# Patient Record
Sex: Female | Born: 1980 | Race: White | Hispanic: No | Marital: Married | Smoking: Never smoker
Health system: Southern US, Community
[De-identification: ages and names within clinical notes are randomized; demographics above are authoritative.]

## PROBLEM LIST (undated history)

## (undated) DIAGNOSIS — R5383 Other fatigue: Secondary | ICD-10-CM

## (undated) DIAGNOSIS — R002 Palpitations: Secondary | ICD-10-CM

## (undated) DIAGNOSIS — D509 Iron deficiency anemia, unspecified: Secondary | ICD-10-CM

## (undated) DIAGNOSIS — R202 Paresthesia of skin: Secondary | ICD-10-CM

## (undated) DIAGNOSIS — G43109 Migraine with aura, not intractable, without status migrainosus: Secondary | ICD-10-CM

## (undated) HISTORY — DX: Migraine with aura, not intractable, without status migrainosus: G43.109

## (undated) HISTORY — DX: Iron deficiency anemia, unspecified: D50.9

## (undated) HISTORY — DX: Other fatigue: R53.83

## (undated) HISTORY — DX: Palpitations: R00.2

## (undated) HISTORY — DX: Paresthesia of skin: R20.2

---

## 2004-10-01 ENCOUNTER — Encounter (INDEPENDENT_AMBULATORY_CARE_PROVIDER_SITE_OTHER): Payer: Self-pay | Admitting: *Deleted

## 2004-10-01 ENCOUNTER — Ambulatory Visit (HOSPITAL_COMMUNITY): Admission: RE | Admit: 2004-10-01 | Discharge: 2004-10-01 | Payer: Self-pay | Admitting: Obstetrics and Gynecology

## 2005-04-28 ENCOUNTER — Inpatient Hospital Stay (HOSPITAL_COMMUNITY): Admission: AD | Admit: 2005-04-28 | Discharge: 2005-04-28 | Payer: Self-pay | Admitting: Obstetrics & Gynecology

## 2005-05-16 ENCOUNTER — Other Ambulatory Visit: Admission: RE | Admit: 2005-05-16 | Discharge: 2005-05-16 | Payer: Self-pay | Admitting: Obstetrics and Gynecology

## 2005-08-21 ENCOUNTER — Inpatient Hospital Stay (HOSPITAL_COMMUNITY): Admission: AD | Admit: 2005-08-21 | Discharge: 2005-08-21 | Payer: Self-pay | Admitting: Obstetrics & Gynecology

## 2005-09-15 ENCOUNTER — Ambulatory Visit (HOSPITAL_COMMUNITY): Admission: AD | Admit: 2005-09-15 | Discharge: 2005-09-15 | Payer: Self-pay | Admitting: Obstetrics and Gynecology

## 2005-10-12 ENCOUNTER — Inpatient Hospital Stay (HOSPITAL_COMMUNITY): Admission: AD | Admit: 2005-10-12 | Discharge: 2005-10-12 | Payer: Self-pay | Admitting: Obstetrics and Gynecology

## 2005-10-18 ENCOUNTER — Ambulatory Visit: Payer: Self-pay | Admitting: *Deleted

## 2005-10-25 ENCOUNTER — Ambulatory Visit: Payer: Self-pay | Admitting: *Deleted

## 2005-11-01 ENCOUNTER — Observation Stay (HOSPITAL_COMMUNITY): Admission: AD | Admit: 2005-11-01 | Discharge: 2005-11-03 | Payer: Self-pay | Admitting: Obstetrics and Gynecology

## 2005-11-01 ENCOUNTER — Ambulatory Visit: Payer: Self-pay | Admitting: *Deleted

## 2005-11-08 ENCOUNTER — Ambulatory Visit: Payer: Self-pay | Admitting: *Deleted

## 2005-11-11 ENCOUNTER — Ambulatory Visit: Payer: Self-pay | Admitting: *Deleted

## 2005-11-13 ENCOUNTER — Inpatient Hospital Stay (HOSPITAL_COMMUNITY): Admission: AD | Admit: 2005-11-13 | Discharge: 2005-11-13 | Payer: Self-pay | Admitting: Obstetrics and Gynecology

## 2005-11-15 ENCOUNTER — Inpatient Hospital Stay (HOSPITAL_COMMUNITY): Admission: AD | Admit: 2005-11-15 | Discharge: 2005-11-15 | Payer: Self-pay | Admitting: Obstetrics and Gynecology

## 2005-11-18 ENCOUNTER — Inpatient Hospital Stay (HOSPITAL_COMMUNITY): Admission: RE | Admit: 2005-11-18 | Discharge: 2005-11-21 | Payer: Self-pay | Admitting: Obstetrics and Gynecology

## 2005-11-18 ENCOUNTER — Encounter (INDEPENDENT_AMBULATORY_CARE_PROVIDER_SITE_OTHER): Payer: Self-pay | Admitting: *Deleted

## 2005-12-26 ENCOUNTER — Other Ambulatory Visit: Admission: RE | Admit: 2005-12-26 | Discharge: 2005-12-26 | Payer: Self-pay | Admitting: Obstetrics and Gynecology

## 2006-08-02 ENCOUNTER — Encounter: Admission: RE | Admit: 2006-08-02 | Discharge: 2006-10-31 | Payer: Self-pay | Admitting: Emergency Medicine

## 2007-02-07 ENCOUNTER — Ambulatory Visit (HOSPITAL_COMMUNITY): Admission: RE | Admit: 2007-02-07 | Discharge: 2007-02-07 | Payer: Self-pay | Admitting: Obstetrics and Gynecology

## 2007-04-02 IMAGING — US US FETAL BPP W/O NONSTRESS
2 series · 13 of 28 positions shown · non-contrast
Comparison: none

CLINICAL DATA: TWIN BIOPHYSICAL PROFILE:

[Series 1: us fetal bpp w/o nonstress · 0.37mm/px · 2 of 4 slices shown (1 of 2)]
[im 2/4]
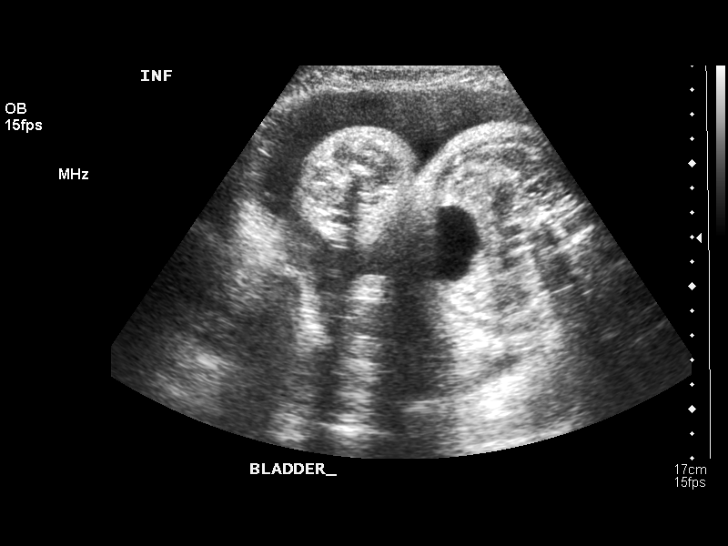
[im 4/4]
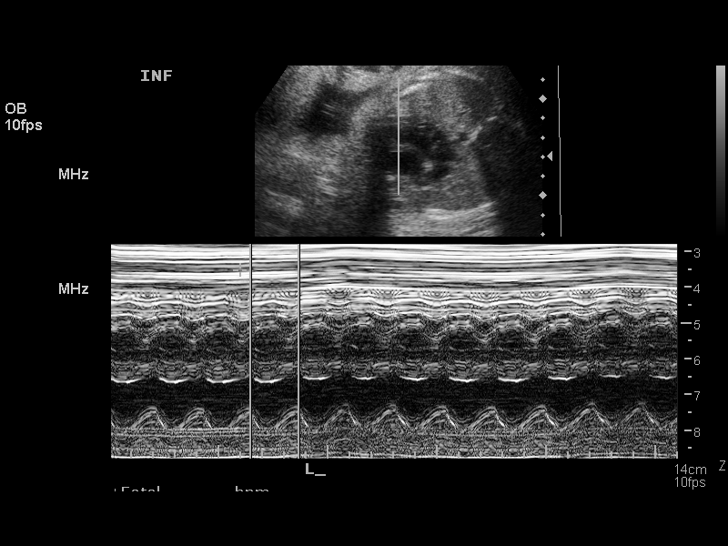

[Series 1: us fetal bpp w/o nonstress · 0.37mm/px · 11 of 24 slices shown (2 of 2)]
[im 2/24]
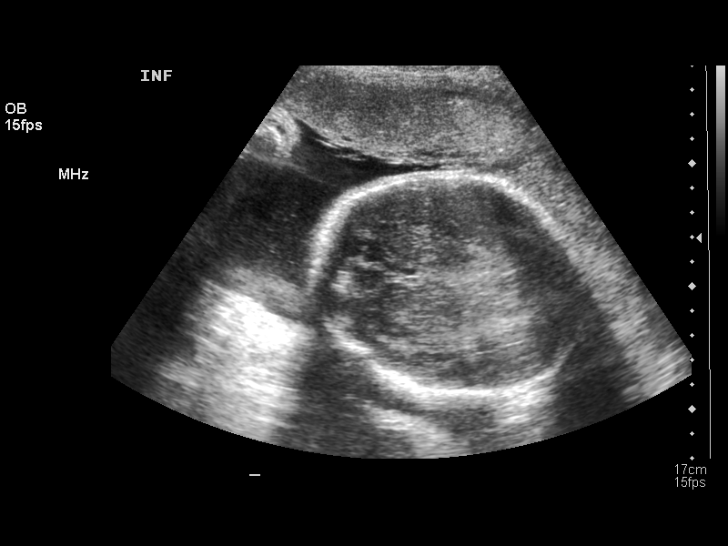
[im 4/24]
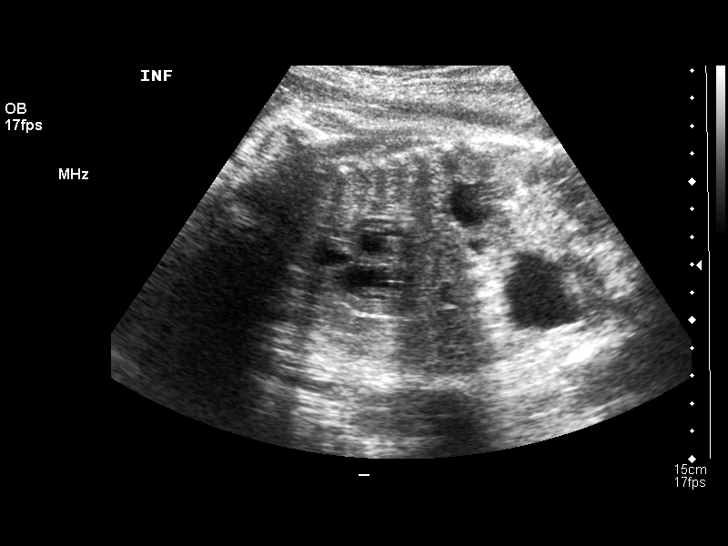
[im 6/24]
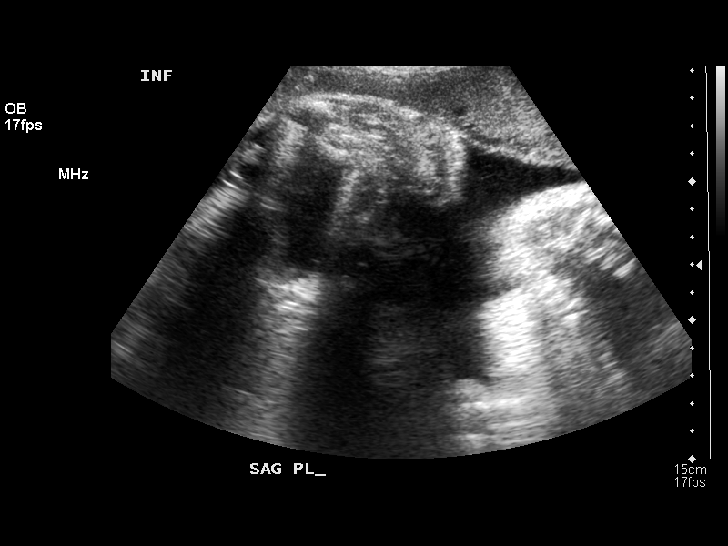
[im 8/24]
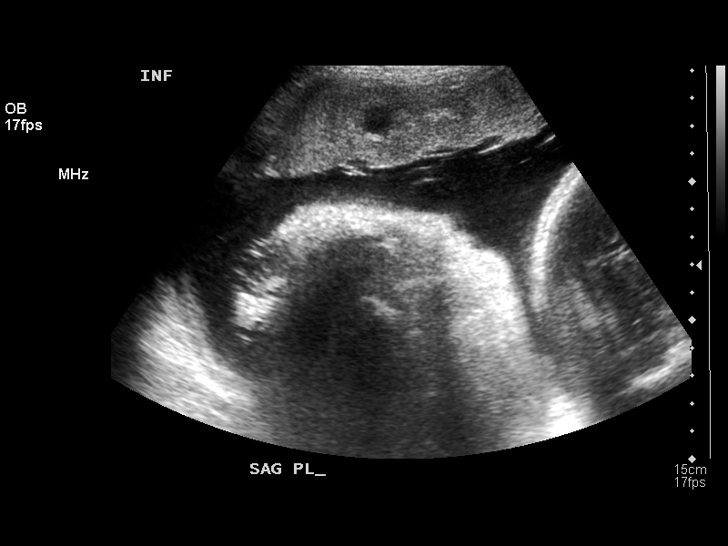
[im 11/24]
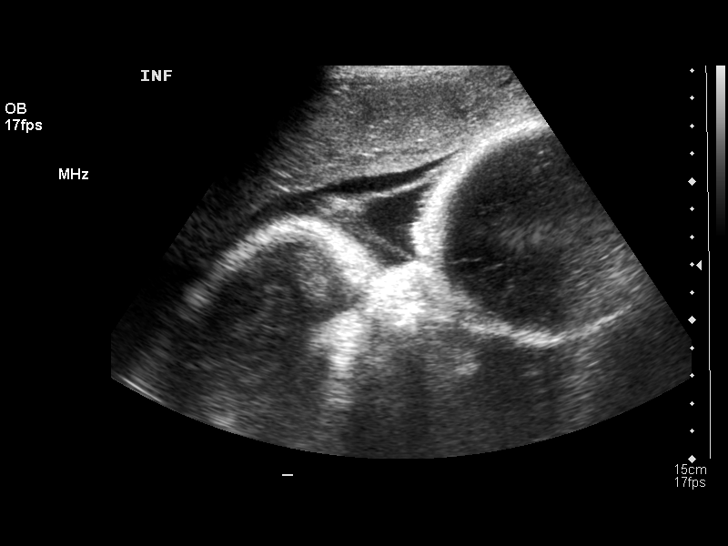
[im 13/24]
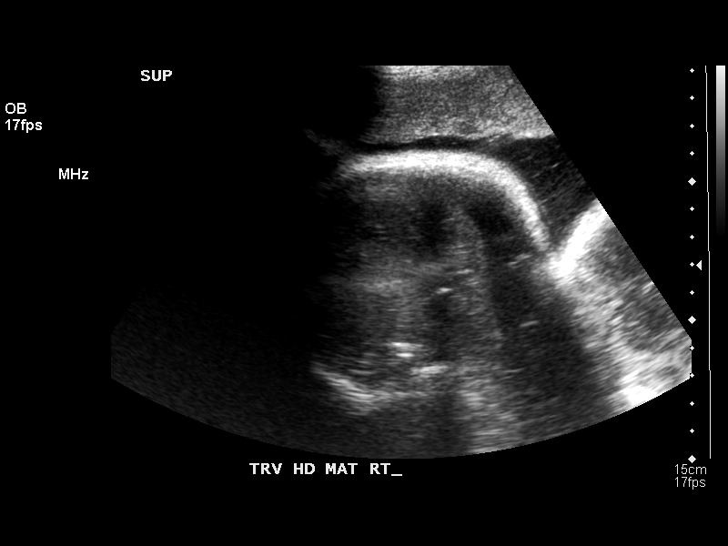
[im 15/24]
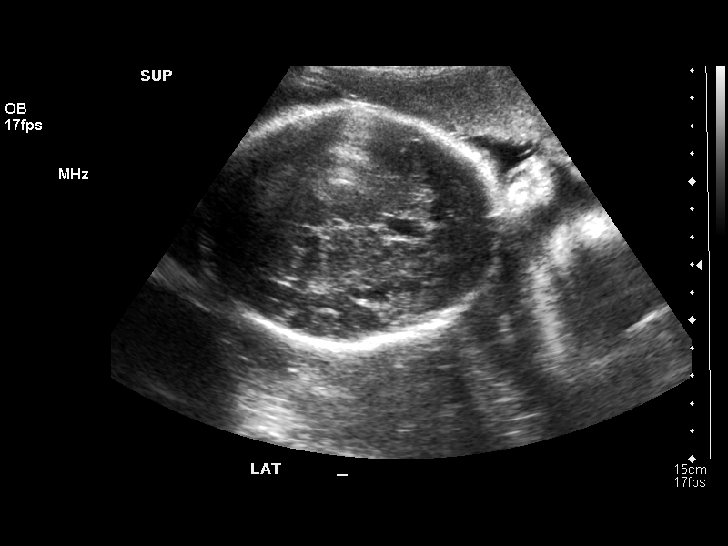
[im 17/24]
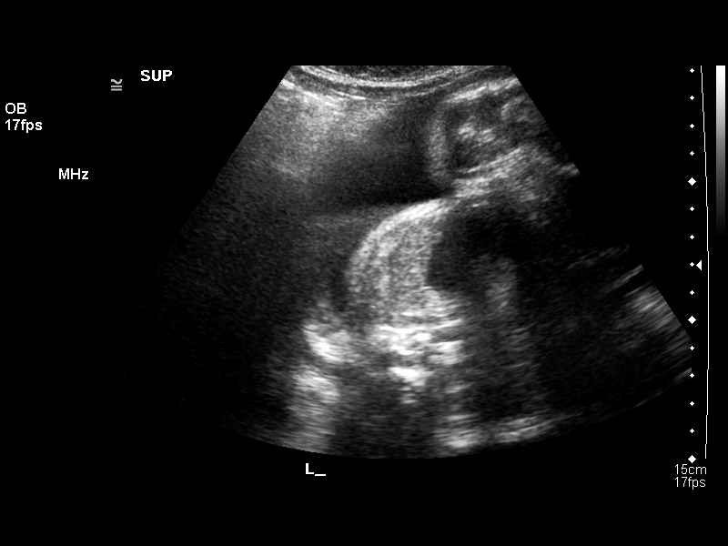
[im 19/24]
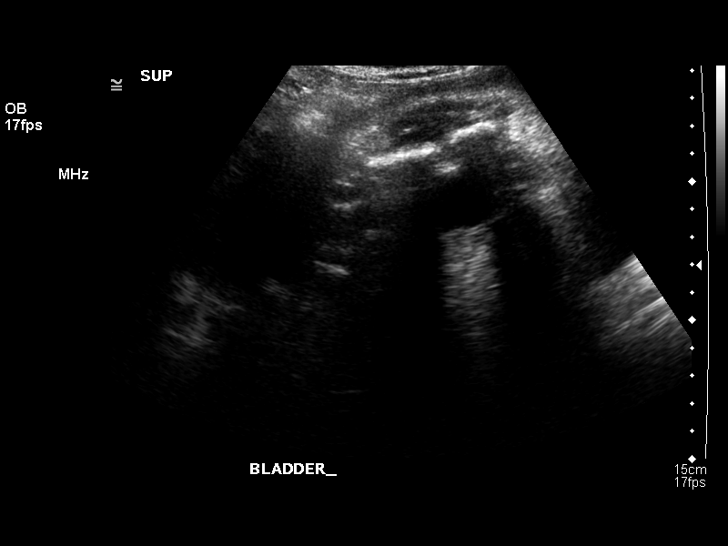
[im 21/24]
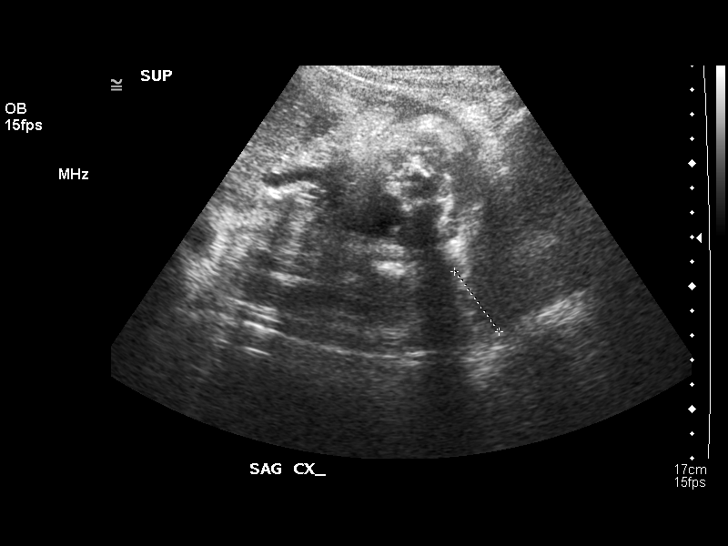
[im 23/24]
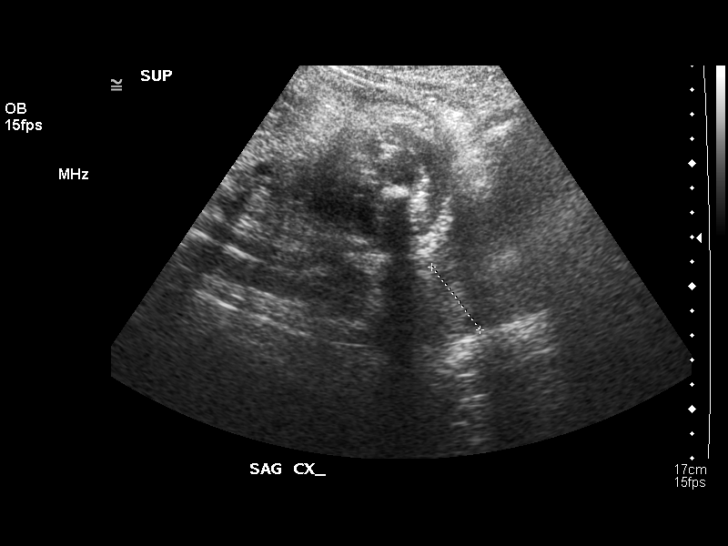

[13 of 28 positions shown; findings below may reference images not displayed]

FETUS A:
 Heart rate:      136 bpm
 Movement:  yes
 Breathing:  yes
 Presentation:  inferiorly in breech position
 Placental Location:  anterior
 Grade:  I
 Previa:  no
 Amniotic Fluid (Subjective):  normal
 Amniotic Fluid (Objective):  6.6 cm vertical pocket, which is within normal limits   

 Fetal measurements and complete anatomic evaluation were not requested.  The following fetal anatomy was visualized on this exam:  Stomach and bladder.

 BPP SCORING (TWIN A)
 Movement:  2  Time:  10 minutes
 Breathing:  2
 Tone:  2
 Amniotic Fluid:  2
 Total Score:  8

 FETUS B:
 Heart rate:      144 bpm
 Movement:  yes
 Breathing:  yes
 Presentation:  transverse with head on maternal right
 Placental Location:  anterior
 Grade:  I
 Previa:  no
 Amniotic Fluid (Subjective):  normal
 Amniotic Fluid (Objective):  5.2 cm vertical pocket, which is within normal limits   

 Fetal measurements and complete anatomic evaluation were not requested.  The following fetal anatomy was visualized on this exam:  Lateral ventricles, stomach, kidneys, and bladder.

 BPP SCORING (TWIN B)
 Movement:  2  Time:  10 minutes
 Breathing:  2
 Tone:  2
 Amniotic Fluid:  2
 Total Score:  8

 MATERNAL UTERINE AND ADNEXAL FINDINGS
 Cervix:  3.0 transabdominally.
IMPRESSION: Biophysical profile score for fetus A and fetus B is [DATE] over 10 minutes.

## 2007-04-11 ENCOUNTER — Inpatient Hospital Stay (HOSPITAL_COMMUNITY): Admission: AD | Admit: 2007-04-11 | Discharge: 2007-04-11 | Payer: Self-pay | Admitting: Obstetrics and Gynecology

## 2007-05-03 ENCOUNTER — Inpatient Hospital Stay (HOSPITAL_COMMUNITY): Admission: AD | Admit: 2007-05-03 | Discharge: 2007-05-05 | Payer: Self-pay | Admitting: Obstetrics and Gynecology

## 2007-05-03 ENCOUNTER — Encounter (INDEPENDENT_AMBULATORY_CARE_PROVIDER_SITE_OTHER): Payer: Self-pay | Admitting: Obstetrics and Gynecology

## 2008-10-29 ENCOUNTER — Encounter: Admission: RE | Admit: 2008-10-29 | Discharge: 2008-10-29 | Payer: Self-pay | Admitting: Obstetrics and Gynecology

## 2010-12-11 ENCOUNTER — Encounter: Payer: Self-pay | Admitting: Obstetrics and Gynecology

## 2011-04-08 NOTE — Op Note (Signed)
[name], [name]                  ACCOUNT NO.:  [id number]   MEDICAL RECORD NO.:  [id number]          PATIENT TYPE:  INP   LOCATION:  9199                          FACILITY:  [facility]   PHYSICIAN:  Juluis Mire, M.D.   DATE OF BIRTH:  1981/04/19   DATE OF PROCEDURE:  11/18/2005  DATE OF DISCHARGE:                                 OPERATIVE REPORT   PREOPERATIVE DIAGNOSES:  1.  Twin pregnancies at 37 weeks with pregnancy-induced hypertension.  2.  Infants in the breech and transverse lie.   POSTOPERATIVE DIAGNOSES:  1.  Twin pregnancies at 37 weeks with pregnancy-induced hypertension.  2.  Infants in the breech and transverse lie.   OPERATIVE PROCEDURE:  Low transverse cesarean section.   SURGEON:  Juluis Mire, M.D.   ASSISTANT:  Stann Mainland. Vincente Poli, M.D.   ANESTHESIA:  Spinal.   ESTIMATED BLOOD LOSS:  500-600 mL.   PACKS AND DRAINS:  None.   INTRAOPERATIVE BLOOD REPLACED:  None.   COMPLICATIONS:  None.   INDICATIONS:  Noted in the history and physical.   PROCEDURE:  The patient was taken to the OR and placed in the supine  position with a left lateral tilt.  After a satisfactory level of spinal  anesthesia obtained, the abdomen was prepped out with Betadine and draped in  a sterile field.  A low transverse skin incision made with a knife, carried  through subcutaneous tissue.  The fascia was entered sharply and the  incision in the fascia extended laterally.  The fascia taken off the muscle  superiorly and inferiorly.  The rectus muscles were separated in the  midline.  The peritoneum was entered sharply, the incision in the peritoneum  extended both superiorly and inferiorly.  A low transverse bladder flap was  developed.  A low transverse uterine incision was begun with the knife and  extended laterally using manual traction.  Twin A presented in the breech  presentation and was delivered in the usual manner.  The infant was a viable  female.  Apgars were 8/9.  Twin  B was converted from transverse to vertex  presentation, membranes were ruptured, and the infant was delivered with  elevation of the head and fundal pressure, and was a viable female whose  Apgars were 8/9.  The placenta was then delivered manually and sent to  pathological review.  The uterus was closed in a running locking suture of 0  chromic in a two-layered closure technique.  Hemostasis was excellent.  Tubes and ovaries were unremarkable.  Muscles reapproximated with a running  suture of 3-0 Vicryl, fascia with a running suture of 0 PDS.  The skin was closed with staples and Steri-Strips.  Sponge, instrument and  needle count reported as correct by circulating nurse x2, Foley catheter  remained clear at time of closure.  The patient tolerated the procedure  well, was returned to the recovery room in good condition.      Juluis Mire, M.D.  Electronically Signed     JSM/MEDQ  D:  11/18/2005  T:  11/18/2005  Job:  [id number]

## 2011-04-08 NOTE — H&P (Signed)
NAME:  [name], [name] NO.:  [id number]   MEDICAL RECORD NO.:  [id number]           PATIENT TYPE:   LOCATION:                                 FACILITY:   PHYSICIAN:  Juluis Mire, M.D.        DATE OF BIRTH:   DATE OF ADMISSION:  10/01/2004  DATE OF DISCHARGE:                                HISTORY & PHYSICAL   HISTORY OF PRESENT ILLNESS:  The patient is a 30 year old gravida 1, para 0,  married white female, last menstrual period of July 22, 2004.  Recently  has moved to this area from [place].  Has been having trouble with first  trimester spotting.  Serial ultrasounds have revealed a gestational sac  consistent with seven weeks.  There was no evidence of fetal pole.  There  was an enlarging yoke sac. This was consistent with a nonviable trimester  pregnancy and the patient now presents for dilatation and evacuation.  Blood  type is A-.   ALLERGIES:  No known drug allergies.   CURRENT MEDICATIONS:  Prenatal vitamins.   PAST MEDICAL HISTORY:  Usual childhood diseases.  Does have a history of  migraine headaches.  The only surgical history is wisdom teeth extraction.   FAMILY HISTORY:  Noncontributory.   SOCIAL HISTORY:  No tobacco or alcohol use.   REVIEW OF SYSTEMS:  Noncontributory.   PHYSICAL EXAMINATION:  VITAL SIGNS:  The patient is afebrile with stable  vital signs.  HEENT:  The patient is normocephalic.  Pupils equal, round, reactive to  light and accommodation.  Extraocular movements were intact.  Sclerae and  conjunctivae and oropharynx are clear.  NECK:  Without thyromegaly.  BREASTS:  Not examined.  LUNGS:  Clear.  CARDIAC:  A regular rhythm and rate without murmurs or gallops.  ABDOMEN:  Exam is benign.  No masses or organomegaly or tenderness.  PELVIC:  Normal external genitalia.  Vagina is closed and clear.  Cervix is  unremarkable.  Uterus is 8 weeks in size.  Adnexa unremarkable.  EXTREMITIES:  Trace edema.  NEUROLOGICAL:  Exam  is grossly within normal limits.   IMPRESSION:  Nonviable first trimester pregnancy.   PLAN:  The patient will undergo dilatation and evacuation.  The risks of  surgery have been discussed including the risk of infection.  The risk of  hemorrhage that would require transfusion and possible repeat dilatation and  curettage.  The risk of perforation that could lead to injury to adjacent organs  requiring further exploratory surgery.  The risk of deep venous thrombosis  and pulmonary embolus.  The patient verbalizes an understanding of the  indications and risks.      JSM/MEDQ  D:  10/01/2004  T:  10/01/2004  Job:  [id number]

## 2011-04-08 NOTE — H&P (Signed)
[name], [name]                  ACCOUNT NO.:  [id number]   MEDICAL RECORD NO.:  [id number]          PATIENT TYPE:  INP   LOCATION:                                [facility]   PHYSICIAN:  Zelphia Cairo, MD    DATE OF BIRTH:  [birth year]   DATE OF ADMISSION:  11/01/2005  DATE OF DISCHARGE:                                HISTORY & PHYSICAL   ADMISSION DIAGNOSES:  1.  Intrauterine pregnancy at 34 and 3 weeks.  2.  Twins.  3.  Elevated blood pressure.   HISTORY OF PRESENT ILLNESS:  A 30 year old G2, P0-0-1-0, at 69 and 2/7ths  weeks gestation, who presented to the office today with elevated blood  pressure of 138/84, and increasing edema in her hands and face.  She denies  any complaints of contractions, cramping or vaginal bleeding.  She does  complain of some blurred vision, headache and epigastric pain.   PAST MEDICAL HISTORY:  Negative.   PAST SURGICAL HISTORY:  D&E in 2005.   SOCIAL HISTORY:  Negative for tobacco, alcohol, or drug use.   OBSTETRIC HISTORY:  In 2005, she had a missed AB.  D&E with no complications  at 9-[redacted] weeks gestation.   GYNECOLOGIC HISTORY:  Negative for abnormal Pap smears or sexually  transmitted infections.  Pap smear this pregnancy was normal.   PRENATAL CARE:  Blood type is A negative.  She is status post RhoGAM on  September 15, 2005.   PHYSICAL EXAMINATION:  VITAL SIGNS:  Weight 205 pounds.  Blood pressure 123-  150/75-89.  ABDOMEN:  Gravid and nontender.  EXTREMITIES:  There was 2+ edema.  Symmetrical.   LABORATORY VALUES:  Preeclampsia labs are normal.  Potassium is 2.7.   ASSESSMENT AND PLAN:  A 30 year old G2, P0 at 34 weeks with twin gestation  and elevated blood pressures.  1.  Preeclampsia labs were normal; however, we will admit her for a 24-hour      urine collection and repeat labs in      the morning, given her symptoms of preeclampsia.  2.  We will repeat her NST and labs in the morning.  3.  Potassium replacement  p.o.      Zelphia Cairo, MD  Electronically Signed     GA/MEDQ  D:  11/01/2005  T:  11/01/2005  Job:  [id number]

## 2011-04-08 NOTE — H&P (Signed)
NAMEMirka, Barbone Jasira                  ACCOUNT NO.:  [id number]   MEDICAL RECORD NO.:  [id number]          PATIENT TYPE:  INP   LOCATION:  9126                          [facility]ILITY[facility]   PHYSICIAN:  Juluis Mire, M.D.   DATE OF BIRTH:  [birth year]   DATE OF ADMISSION:  05/03/2007  DATE OF DISCHARGE:                              HISTORY & PHYSICAL   The patient is a 25-year gravida 3, para 2 female who presents to [facility]  for evaluation of decreased amniotic fluid.   The patient has a history of prior cesarean section, had a scheduled  cesarean section next week.  We saw her today, amniotic fluid index was  6.8 cm with the 4th percentile.  Cervix was extremely unfavorable being  long and closed. After discussion of options we decided to proceed with  repeat cesarean section for which she is admitted at the present time.  The option of trial of labor have been discussed but in view of the  unfavorable cervix and decreased fluid it was decided against.   ALLERGIES:  Sensitivity to many NARCOTICS.   MEDICATIONS:  Include prenatal vitamins.   PAST MEDICAL HISTORY.:   FAMILY HISTORY AND SOCIAL HISTORY:  Please see prenatal records.   REVIEW OF SYSTEMS:  Noncontributory.   PHYSICAL EXAMINATION:  VITAL SIGNS:  The patient afebrile, stable vital  signs.  HEENT: The patient is normocephalic.  Pupils equal and react to light  accommodation.  Extraocular movements were intact.  Sclerae and  conjunctivae are clear.  Oropharynx clear.  NECK:  Without thyromegaly.  BREASTS:  Not examined.  LUNGS:  Clear.  HEART:  Regular rhythm rate, grade 2/6 systolic ejection murmur.  No  clicks or gallops.  ABDOMEN: Gravid uterus consistent with dates.  PELVIC: The cervix is long and closed.  EXTREMITIES:  Trace edema.  NEUROLOGY:  Grossly within normal limits.   IMPRESSION:  1. Intrauterine pregnancy at 39+ weeks.  2. Prior cesarean section.  3. Oligohydramnios.   PLAN:  The patient undergo primary  cesarean section.  The risks have  been discussed including the risk of infection.  The risk of hemorrhage  could require transfusion with the risk of AIDS or hepatitis.  The risk  of injury to adjacent organs including bladder, bowel, ureters that  could require further exploratory surgery.  Risk of deep venous  thrombosis, pulmonary embolus.  The patient does understand indications  and risks.      Juluis Mire, M.D.  Electronically Signed     JSM/MEDQ  D:  05/03/2007  T:  05/03/2007  Job:  [id number]

## 2011-04-08 NOTE — Discharge Summary (Signed)
NAMEDayra, [name]                  ACCOUNT NO.:  [id number]   MEDICAL RECORD NO.:  [id number]          PATIENT TYPE:  INP   LOCATION:  9126                          [facility]ACILITY[facility]   PHYSICIAN:  Juluis Mire, M.D.   DATE OF BIRTH:  1981/11/01   DATE OF ADMISSION:  05/03/2007  DATE OF DISCHARGE:  05/05/2007                               DISCHARGE SUMMARY   ADMISSION DIAGNOSES:  1. Intrauterine pregnancy at 17 weeks' estimated gestational age.  2. Previous cesarean section.  3. Oligohydramnios.   DISCHARGE DIAGNOSIS:  Status post low transverse cesarean section with a  viable female infant.   PROCEDURE:  Repeat low transverse cesarean section.   REASON FOR ADMISSION:  Please see dictated H&P.   HOSPITAL COURSE:  The patient is 30 year old gravida 3, para 2 that  presented to [facility] for evaluation due to decreased  amniotic fluid.  The patient had had a history of a previous cesarean  section and was scheduled for cesarean in approximately 1 week.  The  patient was seen on the day of admission and was noted amniotic fluid  index of 6.8 which was in the 4th percentile.  Cervix was extremely  unfavorable long and closed.  After discussion decision was made to  proceed with a repeat cesarean section.  The patient was admitted now  for delivery.  The patient was then transferred to the operating room  where spinal anesthesia was administered without difficulty.  Low  transverse incision was made with delivery of a viable female infant  weighing 8 pounds 10 ounces Apgars 9 at 1 and 9 at 5 minutes.  Arterial  cord pH of 7.30.  The patient tolerated procedure well and taken to the  recovery room in stable condition.  On postoperative day #1 the patient  was without complaint.  Vital signs were stable.  Abdomen soft.  Fundus  firm and nontender.  Abdominal dressing was noted to be clean, dry and  intact.  Urine output was noted to be good and laboratory findings  revealed  hemoglobin of 11.3.  Postoperative day #2 the patient did  desire early discharge.  Vital signs were stable.  She is afebrile.  Fundus firm and nontender.  Abdominal dressing had been removed  revealing an incision was that was clean, dry and intact.  Staples were  intact.  Discharge instructions were reviewed and the patient was later  discharged home.   CONDITION ON DISCHARGE:  Good, diet regular as tolerated.   ACTIVITY:  No heavy lifting, no driving x2 weeks, no vaginal entry.   FOLLOW UP:  Patient to follow up in the office in 2-3 days for staple  removal.  She is to call for temperature greater than 100 degrees,  persistent nausea, vomiting, heavy vaginal bleeding and/or redness or  drainage from incisional site.   DISCHARGE MEDICATIONS:  1. Tylox #30 one p.o. q.4-6 hours.  2. Motrin 600 mg every 6 hours.  3. Prenatal vitamins one p.o. daily.  4. Colace one p.o. daily p.r.n.      Julio Sicks, N.P.  Juluis Mire, M.D.  Electronically Signed    CC/MEDQ  D:  06/04/2007  T:  06/04/2007  Job:  [id number]

## 2011-04-08 NOTE — H&P (Signed)
NAMERomelia, [name]                  ACCOUNT NO.:  [id number]   MEDICAL RECORD NO.:  [id number]          PATIENT TYPE:   LOCATION:                                 FACILITY:   PHYSICIAN:  Juluis Mire, M.D.        DATE OF BIRTH:   DATE OF ADMISSION:  11/18/2005  DATE OF DISCHARGE:                                HISTORY & PHYSICAL   Patient is a 30 year old gravida 2, para 0, abortus 1 female.  Last  menstrual period April 14 giving her an estimated date of confinement of  January 20.  This gives her an estimated gestational age of [redacted] weeks.  This  is consistent with initial evaluation and early ultrasound.   Her prenatal course has been complicated by twin pregnancies.  The twins  have been in the breech transverse lie.  It has also been complicated by  pregnancy induced hypertension.  She has developed elevated blood pressures  with systolics of 140-150 over diastolics between 80 and 90.  We have had  her on bed rest and close monitoring.  Because she is 37 weeks with twins  and development of pregnancy induced hypertension she now presents for  primary cesarean section.   ALLERGIES:  No known drug allergies.   MEDICATIONS:  Prenatal vitamins.   For past medical history, family history, and social history please see  prenatal records.   REVIEW OF SYSTEMS:  Noncontributory.   PHYSICAL EXAMINATION:  VITAL SIGNS:  Patient is afebrile.  Stable vital  signs.  HEENT:  Patient normocephalic.  Pupils are equal, round, and reactive to  light and accommodation.  Extraocular movements are intact.  Sclerae and  conjunctivae clear.  Oropharynx clear.  NECK:  Without thyromegaly.  BREASTS:  Glandular, but no discrete masses.  LUNGS:  Clear.  CARDIOVASCULAR:  Regular rate and rhythm.  Grade 2/6 systolic ejection  murmur.  No clicks or gallops.  ABDOMEN:  Gravid uterus.  PELVIC:  Deferred.  EXTREMITIES:  2-3+ edema.  Deep tendon reflexes 2+.  No clonus.   IMPRESSION:  1.  Twin  pregnancies at 37 weeks.  2.  Pregnancy induced hypertension.  3.  Breech transverse presentation.   PLAN:  The patient will undergo primary cesarean section.  The risks were  discussed including the risks of infection, risk of hemorrhage that could  require transfusion, risk of AIDS or hepatitis, risk of injury to adjacent  organs including bladder, bowel, ureters that could require further  exploratory surgery, risk of deep venous thrombosis and pulmonary embolus.  Patient expressed understanding of indications and risks.      Juluis Mire, M.D.  Electronically Signed     JSM/MEDQ  D:  11/18/2005  T:  11/18/2005  Job:  [id number]

## 2011-04-08 NOTE — Op Note (Signed)
NAMEErnestine, [name]                  ACCOUNT NO.:  [id number]   MEDICAL RECORD NO.:  [id number]          PATIENT TYPE:  AMB   LOCATION:  SDC                           [facility]   PHYSICIAN:  Juluis Mire, M.D.   DATE OF BIRTH:  [birth year]   DATE OF PROCEDURE:  10/01/2004  DATE OF DISCHARGE:                                 OPERATIVE REPORT   PREOPERATIVE DIAGNOSES:  Nonviable first trimester pregnancy.   POSTOPERATIVE DIAGNOSES:  Nonviable first trimester pregnancy.   OPERATION:  1.  Paracervical block.  2.  Cervical dilatation.  3.  Intrauterine evacuation.   SURGEON:  Juluis Mire, M.D.   ANESTHESIA:  Sedation with paracervical block.   ESTIMATED BLOOD LOSS:  Minimal.   PACKS/DRAINS:  None.   INTRAOPERATIVE BLOOD REPLACED:  None.   COMPLICATIONS:  None.   INDICATIONS FOR PROCEDURE:  Dictated in history and physical.   DESCRIPTION OF PROCEDURE:  The patient was taken to the OR, placed in the  supine position.  After sedation was placed in the dorsal lithotomy position  using the Allen stirrups. The patient was draped in a sterile field, a  speculum was placed in the vaginal vault. The cervix and vagina were  cleansed with Betadine. The paracervical block was instituted using 1%  Nesacaine. The cervix was grasped with a single tooth tenaculum, uterus was  posterior in orientation. It sounded to approximately 10 cm. The cervix was  serially dilated to a size 26 Pratt dilator.  Intrauterine contents were  emptied using suction curetting. This continued until no additional tissue  was obtained. At this point in time, sharp and repeat suction curetting  revealed all quadrants to be clear. The uterus was contracting down well  with minimal bleeding, there were no signs of perforation. A single tooth  tenaculum and speculum were then removed, patient taken out of the dorsal  lithotomy position once alert and transferred to the recovery room in  good condition. Sponge,  needle and instrument counts were reported as  correct by the circulating nurse. __________ will be given.  `      JSM/MEDQ  D:  10/01/2004  T:  10/01/2004  Job:  [id number]

## 2011-04-08 NOTE — Op Note (Signed)
NAMESindy, [name]                  ACCOUNT NO.:  [id number]   MEDICAL RECORD NO.:  [id number]          PATIENT TYPE:  INP   LOCATION:  9126                          [facility]   PHYSICIAN:  Juluis Mire, M.D.   DATE OF BIRTH:  March 06, 1981   DATE OF PROCEDURE:  05/03/2007  DATE OF DISCHARGE:                               OPERATIVE REPORT   PREOPERATIVE DIAGNOSIS:  Intrauterine pregnancy at 40+ weeks with prior  cesarean section.  Oligohydramnios.   POSTOPERATIVE DIAGNOSIS:  Intrauterine pregnancy at 40+ weeks with prior  cesarean section.  Oligohydramnios.   PROCEDURES:  Repeat low transverse cesarean section.   SURGEON:  Juluis Mire, M.D.   ANESTHESIA:  Spinal.   ESTIMATED BLOOD LOSS:  400-500 mL.   PACKS AND DRAINS:  None.   BLOOD REPLACED:  None.   COMPLICATIONS:  None.   INDICATIONS:  Dictated history and physical.   DESCRIPTION OF PROCEDURE:  The patient was taken to OR, placed supine  position with left lateral tilt.  After satisfactory level spinal  anesthesia obtained, abdomen prepped out Betadine and draped sterile  field.  Low transverse skin incision made knife carried through  subcutaneous tissue.  Fascia was entered sharply, incision to fascia  extended laterally.  Fascia taken off the muscle superiorly inferiorly.  Rectus muscles were separated midline. Anterior perineum was entered  sharply, incision of perineum extended both superiorly inferiorly.  A  low transverse bladder flap developed.  A low transverse uterine  incision begun with knife extended laterally using manual traction.  The  infant presented vertex presentation, delivered with elevation head and  fundal pressure. The infant was a viable female weighing 8 pounds 10  ounces Apgars were 9/9.  Umbilical artery pH was 7.30.  Amniotic fluid  was clear.  The placenta was delivered manually and sent to pathology.  Uterus then closed with locking suture of 0 chromic using two-layer  closure  technique.  We had good hemostasis and clear urine output.  Tubes and ovaries unremarkable.  We irrigated the pelvis and had good  hemostasis.  At this point time muscles and peritoneum closed with a  suture of 3-0  Vicryl.  Fascia closed with a suture of 0 PDS.  Skin was closed staples  and Steri-Strips.  Sponge, instrument and needle count was correct by  circulating nurse x2.  Foley catheter remained clear at time of closure.  The patient tolerated the procedure well and returned to the recovery  room in good condition.      Juluis Mire, M.D.  Electronically Signed     JSM/MEDQ  D:  05/03/2007  T:  05/03/2007  Job:  [id number]

## 2011-04-08 NOTE — Discharge Summary (Signed)
NAMEKathyleen, Radice Huber                  ACCOUNT NO.:  [id number]   MEDICAL RECORD NO.:  [id number]          PATIENT TYPE:  INP   LOCATION:  9130                          FACILITY:  [facility]   PHYSICIAN:  Zelphia Cairo, MD    DATE OF BIRTH:  August 12, 1981   DATE OF ADMISSION:  11/18/2005  DATE OF DISCHARGE:  11/21/2005                                 DISCHARGE SUMMARY   ADMITTING DIAGNOSES:  1.  Intrauterine pregnancy at 13 weeks' estimated gestational age.  2.  Twin gestational.  3.  Pregnancy-induced hypertension.   DISCHARGE DIAGNOSES:  1.  Status post low transverse cesarean section.  2.  Viable female infant.   PROCEDURE:  Primary low transverse cesarean section.   REASON FOR ADMISSION:  Please see dictated H&P.   HOSPITAL COURSE:  The patient is a 30 year old gravida 2, para 0 who was  admitted to [facility] at 37 weeks' estimated gestational  age for a cesarean delivery.  The patient was known to have a twin gestation  which twins had been in the breech transverse presentation.  Pregnancy had  also been complicated by pregnancy-induced hypertension. The patient had  developed some elevated blood pressure with systolics of 140-150 over  diastolics between 80-90.  The patient had been on bed rest with close  monitoring. Because the patient was 3 weeks' with a twin gestation with  development of pregnancy induced hypertension, the patient was now admitted  for a scheduled cesarean section.  On the morning of admission the patient  was taken to the operating room where spinal anesthesia was administered  without difficulty.  A low transverse incision was made with delivery of  viable twin A female, weighing 5 pounds 13 ounces with Apgars of 8 at one  minute and 9 at five minutes.  Arterial cord pH was 7.30.  Twin B,  also a  female weighing 5 pounds 5 ounces, Apgars of 8 at one minute and 9 at five  minutes.  Arterial cord pH was also noted to be 7.30.  The patient  tolerated  procedure well and taken to the recovery room in stable condition.  On  postoperative day #1, the patient was without complaint.  Vital signs were  stable.  She was afebrile.  Urine output was within normal limits.  Abdomen  soft.  Fundus firm and nontender.  Abdominal dressing was noted to be clean,  dry and intact.  Laboratory findings revealed hemoglobin of 8.7, platelet  count of 207,000, WBC count of 15.6.  On postoperative day #2,  the patient  was doing well, tolerating a regular diet without complaints of nausea and  vomiting.  She was ambulating well.  Vital signs remained stable.  She was  afebrile.  Abdomen soft.  Fundus firm and nontender.  Incision was clean,  dry and intact.  On postoperative day #3, the patient was without complaint.  Pain was well controlled on oral medication.  Vital signs stable. Blood  pressure 124-130/80s.  Abdomen soft.  Incision was clean, dry and intact.  Staples were removed.  The patient  was later discharged home.   CONDITION ON DISCHARGE:  Good.   DIET:  Regular as tolerated.   ACTIVITY:  No heavy lifting, no driving x2 weeks, no vaginal entry.   FOLLOWUP:  Patient is to follow up in the office in one week for an incision  check.  She is to call for temperature greater than 100 degrees, persistent  nausea and vomiting, heavy vaginal bleeding and/or redness or drainage from  incisional site.   DISCHARGE MEDICATIONS:  1.  Tylox #30 one p.o. q.4-6h. p.r.n.  2.  Motrin 600 mg every 6 hours.  3.  Prenatal vitamins one p.o. daily.  4.  Colace one p.o. daily p.r.n.      Julio Sicks, N.P.      Zelphia Cairo, MD  Electronically Signed    CC/MEDQ  D:  12/12/2005  T:  12/13/2005  Job:  [id number]

## 2011-09-08 LAB — CBC
HCT: 33.6 — ABNORMAL LOW
Hemoglobin: 11.3 — ABNORMAL LOW
Hemoglobin: 12.2
MCHC: 34.4
MCV: 88.8
RBC: 3.74 — ABNORMAL LOW
RBC: 3.98
RDW: 14.5 — ABNORMAL HIGH
WBC: 9.5

## 2011-09-08 LAB — RH IMMUNE GLOB WKUP(>/=20WKS)(NOT WOMEN'S HOSP)

## 2011-09-08 LAB — RPR: RPR Ser Ql: NONREACTIVE

## 2021-11-09 ENCOUNTER — Encounter: Payer: Self-pay | Admitting: *Deleted

## 2021-11-09 ENCOUNTER — Other Ambulatory Visit: Payer: Self-pay | Admitting: *Deleted

## 2021-11-11 ENCOUNTER — Telehealth: Payer: Self-pay | Admitting: *Deleted

## 2021-11-11 NOTE — Telephone Encounter (Signed)
Contacted NP prior to her apt on 12/27 with Dr. Cathie Hoops. Directions provided to the cancer center as patient lives in [place]. She stated that her pcp wanted her to have iron infusion set up. I explained to her that Dr. Cathie Hoops will meet with her first next Tuesday and determine if iron infusions will be needed at that time. I explained to her that she would not be receiving the iron infusion on Tuesday. She gave verbal understanding of this. She stated that she has not tolerated her oral iron tablets due to GI distress. She has been taking these supplements as well as other other herbal supplements consistently. Her iron sats have not improved. I did ask her to bring a list of her herbal OTC supplements that she is currently taking with her next apt.  My chart link sent to her phone and patient will attempt to activate her mychart. She thanked me for calling her prior to the NP apt.

## 2021-11-16 ENCOUNTER — Other Ambulatory Visit: Payer: Self-pay

## 2021-11-16 ENCOUNTER — Inpatient Hospital Stay: Payer: 59

## 2021-11-16 ENCOUNTER — Encounter: Payer: Self-pay | Admitting: Oncology

## 2021-11-16 ENCOUNTER — Inpatient Hospital Stay: Payer: 59 | Attending: Oncology | Admitting: Oncology

## 2021-11-16 VITALS — BP 113/77 | HR 80 | Temp 97.2°F | Wt 236.0 lb

## 2021-11-16 DIAGNOSIS — E611 Iron deficiency: Secondary | ICD-10-CM | POA: Diagnosis present

## 2021-11-16 DIAGNOSIS — Z823 Family history of stroke: Secondary | ICD-10-CM

## 2021-11-16 DIAGNOSIS — Z8261 Family history of arthritis: Secondary | ICD-10-CM | POA: Diagnosis not present

## 2021-11-16 DIAGNOSIS — Z803 Family history of malignant neoplasm of breast: Secondary | ICD-10-CM | POA: Diagnosis not present

## 2021-11-16 DIAGNOSIS — Z801 Family history of malignant neoplasm of trachea, bronchus and lung: Secondary | ICD-10-CM | POA: Insufficient documentation

## 2021-11-16 DIAGNOSIS — Z833 Family history of diabetes mellitus: Secondary | ICD-10-CM

## 2021-11-16 DIAGNOSIS — Z8249 Family history of ischemic heart disease and other diseases of the circulatory system: Secondary | ICD-10-CM

## 2021-11-16 DIAGNOSIS — Z808 Family history of malignant neoplasm of other organs or systems: Secondary | ICD-10-CM | POA: Diagnosis not present

## 2021-11-16 NOTE — Progress Notes (Signed)
Hematology/Oncology Consult note [phone] [phone]      Patient Care Team: Orpha Bur, MD as PCP - General (Family Medicine) Rickard Patience, MD as Consulting Physician (Oncology)  REFERRING PROVIDER: Orpha Bur, MD  CHIEF COMPLAINTS/REASON FOR VISIT:  Evaluation of iron deficiency  HISTORY OF PRESENTING ILLNESS:   [name] is a  40 y.o.  female with PMH listed below was seen in consultation at the request of  Orpha Bur, MD  for evaluation of Iron deficiency  Patient has had blood work done at primary care provider's office and the results were scanned into media.  05/21/2021, iron panel showed saturation of 27, TIBC 442, ferritin 12. 06/21/2021, CBC showed hemoglobin 13.5, hematocrit 41.3, WBC 5.1, platelet count 245,000. 07/08/2021, iron panel showed a TIBC of 425, ferritin 14, iron saturation 21. 10/22/2021, patient had a brain MRI without contrast done for work-up of paresthesia.  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  10/28/2021, iron panel showed TIBC 368, iron saturation 21, ferritin 10. CBC showed a hemoglobin of 13.2, hematocrit 41.2, MCV 88.4.  Normal white count and platelet count.  Patient reports that she has been taking oral iron supplementation since she was informed that she has low iron level Initially she were taking Vitron C 2 tablets daily and she reports having upset stomach and constipation as side effects. Currently she takes 1 tablet of Vitron-C and another tablet of blood builder and feels the combination works better for her.  She reports a history of heavy menstrual bleeding.  Menstrual cycle  Is regular. Denies any black stool or bright red blood in the stool. Patient is in the process of having sleep apnea worked up,  planned sleep study 12/31/2021.Marland Kitchen  Patient reports feeling tired.  She has chronic forearm and hand numbness and tingling.  Review of Systems  Constitutional:  Negative for appetite change, chills, fatigue and fever.  HENT:   Negative for hearing loss and voice change.   Eyes:  Negative for eye problems.  Respiratory:  Negative for chest tightness and cough.   Cardiovascular:  Negative for chest pain.  Gastrointestinal:  Negative for abdominal distention, abdominal pain and blood in stool.  Endocrine: Negative for hot flashes.  Genitourinary:  Negative for difficulty urinating and frequency.   Musculoskeletal:  Negative for arthralgias.  Skin:  Negative for itching and rash.  Neurological:  Positive for numbness. Negative for extremity weakness.  Hematological:  Negative for adenopathy.  Psychiatric/Behavioral:  Negative for confusion.    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    Paresthesia of upper limb     SURGICAL HISTORY: Past Surgical History:  Procedure Laterality Date   CESAREAN SECTION      SOCIAL HISTORY: Social History   Socioeconomic History   Marital status: Married    Spouse name: Not on file   Number of children: Not on file   Years of education: Not on file   Highest education level: Not on file  Occupational History   Not on file  Tobacco Use   Smoking status: Never   Smokeless tobacco: Never  Substance and Sexual Activity   Alcohol use: Yes   Drug  use: Never   Sexual activity: Yes  Other Topics Concern   Not on file  Social History Narrative   Not on file   Social Determinants of Health   Financial Resource Strain: Not on file  Food Insecurity: Not on file  Transportation Needs: Not on file  Physical Activity: Not on file  Stress: Not on file  Social Connections: Not on file  Intimate Partner Violence: Not  on file    FAMILY HISTORY: Family History  Problem Relation Age of Onset   Arthritis/Rheumatoid Mother    Diabetes Mellitus I Father    Breast cancer Sister    Stroke Maternal Grandmother    Throat cancer Maternal Grandfather    Lung cancer Paternal Grandmother    Heart attack Paternal Grandfather     ALLERGIES:  has no allergies on file.  MEDICATIONS:  Current Outpatient Medications  Medication Sig Dispense Refill   ergocalciferol (VITAMIN D2) 1.25 MG (50000 UT) capsule Take 1 capsule by mouth once a week.     Ferrous Sulfate (IRON SUPPLEMENT PO) Take by mouth once.     Iron-Vitamin C (VITRON-C) 65-125 MG TABS Take by mouth once.     No current facility-administered medications for this visit.     PHYSICAL EXAMINATION: ECOG PERFORMANCE STATUS: 0 - Asymptomatic Vitals:   11/16/21 1151  BP: 113/77  Pulse: 80  Temp: (!) 97.2 F (36.2 C)   Filed Weights   11/16/21 1151  Weight: 236 lb (107 kg)    Physical Exam Constitutional:      General: She is not in acute distress. HENT:     Head: Normocephalic and atraumatic.  Eyes:     General: No scleral icterus. Cardiovascular:     Rate and Rhythm: Normal rate and regular rhythm.     Heart sounds: Normal heart sounds.  Pulmonary:     Effort: Pulmonary effort is normal. No respiratory distress.     Breath sounds: No wheezing.  Abdominal:     General: Bowel sounds are normal. There is no distension.     Palpations: Abdomen is soft.  Musculoskeletal:        General: No deformity. Normal range of motion.     Cervical back: Normal range of motion and neck supple.  Skin:    General: Skin is warm and dry.     Findings: No erythema or rash.  Neurological:     Mental Status: She is alert and oriented to person, place, and time. Mental status is at baseline.     Cranial Nerves: No cranial nerve deficit.     Coordination: Coordination normal.  Psychiatric:        Mood and Affect: Mood normal.    LABORATORY DATA:  I  have reviewed the data as listed Lab Results  Component Value Date   WBC 12.6 (H) 05/04/2007   HGB 11.3 (L) 05/04/2007   HCT 33.6 (L) 05/04/2007   MCV 89.8 05/04/2007   PLT 205 05/04/2007   No results for input(s): NA, K, CL, CO2, GLUCOSE, BUN, CREATININE, CALCIUM, GFRNONAA, GFRAA, PROT, ALBUMIN, AST, ALT, ALKPHOS, BILITOT, BILIDIR, IBILI in the last 8760 hours. Iron/TIBC/Ferritin/ %Sat No results found for: IRON, TIBC, FERRITIN, IRONPCTSAT    RADIOGRAPHIC STUDIES: I have personally reviewed the radiological images as listed and agreed with the findings in the report. No results found.    ASSESSMENT & PLAN:  1. Iron deficiency    #I reviewed her previous labs. CBC showed chronically stable and normal level of  hemoglobin.  She has had low level of ferritin, normal iron saturation. Discussed with patient that iron deficiency could be secondary to chronic blood loss, i.e. heavy menstrual bleeding.  Her hemoglobin has been stable and normal.  No urgency to start IV Venofer treatments at this point. In addition, sometimes we do see paradoxical normal or high level of hemoglobin with low ferritin level which could be a sign of secondary erythrocytosis due to medical conditions, ie sleep apnea.  Repeating iron may cause further increase of hemoglobin.  At this point, I recommend patient to continue what she has been doing with oral iron supplementation.  I will hold off IV iron treatments.  We will wait for her to finish her sleep study and rule in or rule out of a component of secondary erythrocytosis.  Should she experience any decreased hemoglobin and further decrease of ferritin, we will offer her IV Venofer treatments.  She agrees with the plan.  Orders Placed This Encounter  Procedures   Ferritin    Standing Status:   Future    Standing Expiration Date:   05/17/2022   CBC with Differential/Platelet    Standing Status:   Future    Standing Expiration Date:   11/16/2022   Retic Panel     Standing Status:   Future    Standing Expiration Date:   11/16/2022   Iron and TIBC    Standing Status:   Future    Standing Expiration Date:   11/16/2022    All questions were answered. The patient knows to call the clinic with any problems questions or concerns.   Natividad Brood, MD    Return of visit: 3 months Thank you for this kind referral and the opportunity to participate in the care of this patient. A copy of today's note is routed to referring provider   Earlie Server, MD, PhD [facility] Hematology Oncology 11/16/2021

## 2022-02-02 ENCOUNTER — Other Ambulatory Visit: Payer: Self-pay | Admitting: *Deleted

## 2022-02-02 DIAGNOSIS — E611 Iron deficiency: Secondary | ICD-10-CM

## 2022-02-11 ENCOUNTER — Inpatient Hospital Stay: Payer: 59 | Attending: Oncology

## 2022-02-11 ENCOUNTER — Other Ambulatory Visit: Payer: Self-pay

## 2022-02-11 DIAGNOSIS — N920 Excessive and frequent menstruation with regular cycle: Secondary | ICD-10-CM | POA: Diagnosis not present

## 2022-02-11 DIAGNOSIS — E611 Iron deficiency: Secondary | ICD-10-CM | POA: Insufficient documentation

## 2022-02-11 DIAGNOSIS — G4730 Sleep apnea, unspecified: Secondary | ICD-10-CM | POA: Diagnosis not present

## 2022-02-11 LAB — CBC WITH DIFFERENTIAL/PLATELET
Abs Immature Granulocytes: 0.02 10*3/uL (ref 0.00–0.07)
Basophils Absolute: 0 10*3/uL (ref 0.0–0.1)
Basophils Relative: 1 %
Eosinophils Absolute: 0.1 10*3/uL (ref 0.0–0.5)
Eosinophils Relative: 1 %
HCT: 39.6 % (ref 36.0–46.0)
Hemoglobin: 12.9 g/dL (ref 12.0–15.0)
Immature Granulocytes: 0 %
Lymphocytes Relative: 34 %
Lymphs Abs: 1.9 10*3/uL (ref 0.7–4.0)
MCH: 29 pg (ref 26.0–34.0)
MCHC: 32.6 g/dL (ref 30.0–36.0)
MCV: 89 fL (ref 80.0–100.0)
Monocytes Absolute: 0.4 10*3/uL (ref 0.1–1.0)
Monocytes Relative: 7 %
Neutro Abs: 3.2 10*3/uL (ref 1.7–7.7)
Neutrophils Relative %: 57 %
Platelets: 245 10*3/uL (ref 150–400)
RBC: 4.45 MIL/uL (ref 3.87–5.11)
RDW: 14 % (ref 11.5–15.5)
WBC: 5.7 10*3/uL (ref 4.0–10.5)
nRBC: 0 % (ref 0.0–0.2)

## 2022-02-11 LAB — RETIC PANEL
Immature Retic Fract: 15.2 % (ref 2.3–15.9)
RBC.: 4.43 MIL/uL (ref 3.87–5.11)
Retic Count, Absolute: 89 10*3/uL (ref 19.0–186.0)
Retic Ct Pct: 2 % (ref 0.4–3.1)
Reticulocyte Hemoglobin: 34.4 pg (ref >27.9)

## 2022-02-11 LAB — IRON AND TIBC
Iron: 53 ug/dL (ref 28–170)
Saturation Ratios: 14 % (ref 10.4–31.8)
TIBC: 392 ug/dL (ref 250–450)
UIBC: 339 ug/dL

## 2022-02-11 LAB — FERRITIN: Ferritin: 17 ng/mL (ref 11–307)

## 2022-02-14 ENCOUNTER — Inpatient Hospital Stay: Payer: 59

## 2022-02-14 ENCOUNTER — Inpatient Hospital Stay: Payer: 59 | Admitting: Oncology

## 2022-02-14 ENCOUNTER — Other Ambulatory Visit: Payer: Self-pay

## 2022-02-14 ENCOUNTER — Other Ambulatory Visit: Payer: 59

## 2022-02-14 ENCOUNTER — Encounter: Payer: Self-pay | Admitting: Oncology

## 2022-02-14 VITALS — BP 129/76 | HR 67 | Temp 96.9°F | Resp 18 | Wt 233.0 lb

## 2022-02-14 DIAGNOSIS — G4730 Sleep apnea, unspecified: Secondary | ICD-10-CM

## 2022-02-14 DIAGNOSIS — E611 Iron deficiency: Secondary | ICD-10-CM | POA: Diagnosis not present

## 2022-02-14 DIAGNOSIS — N920 Excessive and frequent menstruation with regular cycle: Secondary | ICD-10-CM | POA: Diagnosis not present

## 2022-02-14 NOTE — Progress Notes (Signed)
?Hematology/Oncology Progress note ?[phone] [phone] ?  ? ?   ? ? ?Patient Care Team: ?Natividad Brood, MD as PCP - General (Family Medicine) ?Earlie Server, MD as Consulting Physician (Oncology) ? ?REFERRING PROVIDER: ?Natividad Brood, MD  ?CHIEF COMPLAINTS/REASON FOR VISIT:  ?Follow-up for iron deficiency  ? ?HISTORY OF PRESENTING ILLNESS:  ? ?[name] is a  41 y.o.  female with PMH listed below was seen in consultation at the request of  Natividad Brood, MD  for evaluation of Iron deficiency ? ?Patient has had blood work done at primary care provider's office and the results were scanned into media. ? ?05/21/2021, iron panel showed saturation of 27, TIBC 442, ferritin 12. ?06/21/2021, CBC showed hemoglobin 13.5, hematocrit 41.3, WBC 5.1, platelet count 245,000. ?07/08/2021, iron panel showed a TIBC of 425, ferritin 14, iron saturation 21. ?10/22/2021, patient had a brain MRI without contrast done for work-up of paresthesia.  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 ? ?10/28/2021, iron panel showed TIBC 368, iron saturation 21, ferritin 10. ?CBC showed a hemoglobin of 13.2, hematocrit 41.2, MCV 88.4.  Normal white count and platelet count. ? ?Patient reports that she has been taking oral iron supplementation since she was informed that she has low iron level ?Initially she were taking Vitron C 2 tablets daily and she reports having upset stomach and constipation as side effects. ?Currently she takes 1 tablet of Vitron-C and another tablet of blood builder and feels the combination works better for her. ? ?She reports a history of heavy menstrual bleeding.  Menstrual cycle  Is regular. ?Denies any black stool or bright red blood in the stool. ?Patient is in the process of having sleep apnea worked  up, planned sleep study 12/31/2021.Marland Kitchen  Patient reports feeling tired.  She has chronic forearm and hand numbness and tingling. ? ? ? ?INTERVAL HISTORY ?[name] is a 41 y.o. female who has above history reviewed by me today presents for follow up visit for iron deficiency ?Patient currently takes oral iron supplementation ?During interval, patient reports that she has had sleep study done and she was diagnosed with sleep apnea and she was recommended to use CPAP machine at night.  She is waiting for CPAP machine to be delivered.  She also mentioned that she was noticed to have restless leg syndrome and was recommended to keep ferritin above 80. ?Chronic fatigue, unchanged. ?Patient has a chronic history of heavy menstrual bleeding.  Menorrhagia has slightly improved recently. ? ?Review of Systems  ?Constitutional:  Positive for fatigue. Negative for appetite change, chills and fever.  ?HENT:   Negative for hearing loss and voice change.   ?Eyes:  Negative for eye problems.  ?Respiratory:  Negative for chest tightness and cough.   ?Cardiovascular:  Negative for chest pain.  ?Gastrointestinal:  Negative for abdominal distention, abdominal pain and blood in stool.  ?Endocrine: Negative for hot flashes.  ?Genitourinary:  Negative for difficulty urinating and frequency.   ?Musculoskeletal:  Negative for arthralgias.  ?Skin:  Negative for itching and rash.  ?Neurological:  Positive for numbness. Negative for extremity weakness.  ?Hematological:  Negative for adenopathy.  ?Psychiatric/Behavioral:  Negative for confusion.   ? ?MEDICAL HISTORY:  ?Past Medical History:  ?Diagnosis Date  ? Fatigue   ? Heart palpitations   ? IDA (iron deficiency anemia)   ?  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 Paresthesia of upper limb   ? ? ?SURGICAL HISTORY: ?Past Surgical History:  ?Procedure Laterality Date  ? CESAREAN SECTION    ? ? ?SOCIAL HISTORY: ?Social History  ? ?Socioeconomic History  ? Marital status: Married  ?   Spouse name: Not on file  ? Number of children: Not on file  ? Years of education: Not on file  ? Highest education level: Not on file  ?Occupational History  ? Not on file  ?Tobacco Use  ? Smoking status: Never  ? Smokeless tobacco: Never  ?Substance and Sexual Activity  ? Alcohol use: Yes  ? Drug use: Never  ? Sexual activity: Yes  ?Other Topics Concern  ? Not on file  ?Social History Narrative  ? Not on file  ? ?Social Determinants of Health  ? ?Financial Resource Strain: Not on file  ?Food Insecurity: Not on file  ?Transportation Needs: Not on file  ?Physical Activity: Not on file  ?Stress: Not on file  ?Social Connections: Not on file  ?Intimate Partner Violence: Not on file  ? ? ?FAMILY HISTORY: ?Family History  ?Problem Relation Age of Onset  ? Arthritis/Rheumatoid Mother   ? Diabetes Mellitus I Father   ? Breast cancer Sister   ? Stroke Maternal Grandmother   ? Throat cancer Maternal Grandfather   ? Lung cancer Paternal Grandmother   ? Heart attack Paternal Grandfather   ? ? ?ALLERGIES:  has no allergies on file. ? ?MEDICATIONS:  ?Current Outpatient Medications  ?Medication Sig Dispense Refill  ? Iron-Vitamin C 65-125 MG TABS Take by mouth once.    ? MAGNESIUM OXIDE 400 PO Take by mouth.    ? Multiple Vitamin (MULTIVITAMIN ADULT PO) Take 1 tablet by mouth daily. MEGA FOODS: BLOODBUILDER    ? pyridoxine (B-6) 100 MG tablet Take 100 mg by mouth daily.    ? ?No current facility-administered medications for this visit.  ? ? ? ?PHYSICAL EXAMINATION: ?ECOG PERFORMANCE STATUS: 0 - Asymptomatic ?Vitals:  ? 02/14/22 1400  ?BP: 129/76  ?Pulse: 67  ?Resp: 18  ?Temp: (!) 96.9 ?F (36.1 ?C)  ? ?Filed Weights  ? 02/14/22 1400  ?Weight: 233 lb (105.7 kg)  ? ? ?Physical Exam ?Constitutional:   ?   General: She is not in acute distress. ?HENT:  ?   Head: Normocephalic and atraumatic.  ?Eyes:  ?   General: No scleral icterus. ?Cardiovascular:  ?   Rate and Rhythm: Normal rate and regular rhythm.  ?   Heart sounds: Normal heart  sounds.  ?Pulmonary:  ?   Effort: Pulmonary effort is normal. No respiratory distress.  ?   Breath sounds: No wheezing.  ?Abdominal:  ?   General: Bowel sounds are normal. There is no distension.  ?   Palpations: Abdomen is soft.  ?Musculoskeletal:     ?   General: No deformity. Normal range of motion.  ?   Cervical back: Normal range of motion and neck supple.  ?Skin: ?   General: Skin is warm and dry.  ?   Findings: No erythema or rash.  ?Neurological:  ?   Mental Status: She is alert and oriented to person, place, and time. Mental status is at baseline.  ?   Cranial Nerves: No cranial nerve deficit.  ?   Coordination: Coordination normal.  ?Psychiatric:     ?   Mood and Affect: Mood normal.  ? ? ?LABORATORY DATA:  ?I have reviewed  the data as listed ?Lab Results  ?Component Value Date  ? WBC 5.7 02/11/2022  ? HGB 12.9 02/11/2022  ? HCT 39.6 02/11/2022  ? MCV 89.0 02/11/2022  ? PLT 245 02/11/2022  ? ?No results for input(s): NA, K, CL, CO2, GLUCOSE, BUN, CREATININE, CALCIUM, GFRNONAA, GFRAA, PROT, ALBUMIN, AST, ALT, ALKPHOS, BILITOT, BILIDIR, IBILI in the last 8760 hours. ?Iron/TIBC/Ferritin/ %Sat ?   ?Component Value Date/Time  ? IRON 53 02/11/2022 0942  ? TIBC 392 02/11/2022 0942  ? FERRITIN 17 02/11/2022 0942  ? IRONPCTSAT 14 02/11/2022 0942  ?  ? ? ?RADIOGRAPHIC STUDIES: ?I have personally reviewed the radiological images as listed and agreed with the findings in the report. ?No results found. ? ? ? ?ASSESSMENT & PLAN:  ?1. Iron deficiency   ?2. Menorrhagia with regular cycle   ?3. Sleep apnea, unspecified type   ? ?#Iron deficiency ?Labs reviewed and discussed with patient. ?Ferritin has improved to 17, iron saturation is 14.  Hemoglobin is chronically normal. ?I recommend patient to continue iron supplementation once a day. ? ?Regarding to the necessity of IV Venofer treatments, we discussed about the potential side effects of IV Venofer treatments. ?Potential risk of infusion reaction overweighs benefit.   Shared decision was made to hold off IV iron and try iron supplementation for now ?If restless syndrome symptom is severe enough in the future, could consider IV Venofer treatments. ? ?Menorrhagia, t

## 2022-02-14 NOTE — Progress Notes (Signed)
Pt here for follow up. Pt reports that she had a sleep study which showed that she has mild sleep apnea.  ?

## 2022-02-15 ENCOUNTER — Ambulatory Visit: Payer: 59

## 2022-02-15 ENCOUNTER — Ambulatory Visit: Payer: 59 | Admitting: Oncology

## 2022-08-15 ENCOUNTER — Inpatient Hospital Stay: Payer: 59

## 2022-08-15 ENCOUNTER — Inpatient Hospital Stay: Payer: 59 | Attending: Oncology

## 2022-08-15 DIAGNOSIS — E611 Iron deficiency: Secondary | ICD-10-CM | POA: Insufficient documentation

## 2022-08-15 DIAGNOSIS — N920 Excessive and frequent menstruation with regular cycle: Secondary | ICD-10-CM | POA: Insufficient documentation

## 2022-08-15 LAB — COMPREHENSIVE METABOLIC PANEL
ALT: 19 U/L (ref 0–44)
AST: 19 U/L (ref 15–41)
Albumin: 3.9 g/dL (ref 3.5–5.0)
Alkaline Phosphatase: 43 U/L (ref 38–126)
Anion gap: 3 — ABNORMAL LOW (ref 5–15)
BUN: 12 mg/dL (ref 6–20)
CO2: 26 mmol/L (ref 22–32)
Calcium: 8.7 mg/dL — ABNORMAL LOW (ref 8.9–10.3)
Chloride: 107 mmol/L (ref 98–111)
Creatinine, Ser: 0.92 mg/dL (ref 0.44–1.00)
GFR, Estimated: >60 mL/min (ref >60)
Glucose, Bld: 95 mg/dL (ref 70–99)
Potassium: 4.5 mmol/L (ref 3.5–5.1)
Sodium: 136 mmol/L (ref 135–145)
Total Bilirubin: 0.5 mg/dL (ref 0.3–1.2)
Total Protein: 7.6 g/dL (ref 6.5–8.1)

## 2022-08-15 LAB — IRON AND TIBC
Iron: 74 ug/dL (ref 28–170)
Saturation Ratios: 15 % (ref 10.4–31.8)
TIBC: 482 ug/dL — ABNORMAL HIGH (ref 250–450)
UIBC: 408 ug/dL

## 2022-08-15 LAB — FERRITIN: Ferritin: 8 ng/mL — ABNORMAL LOW (ref 11–307)

## 2022-08-16 LAB — VON WILLEBRAND PANEL
Coagulation Factor VIII: 89 % (ref 56–140)
Ristocetin Co-factor, Plasma: 68 % (ref 50–200)
Von Willebrand Antigen, Plasma: 102 % (ref 50–200)

## 2022-08-16 LAB — COAG STUDIES INTERP REPORT

## 2022-08-17 ENCOUNTER — Inpatient Hospital Stay: Payer: 59 | Admitting: Oncology

## 2022-08-17 ENCOUNTER — Inpatient Hospital Stay: Payer: 59

## 2022-08-17 ENCOUNTER — Encounter: Payer: Self-pay | Admitting: Oncology

## 2022-08-17 VITALS — BP 119/73 | HR 63 | Resp 18 | Wt 234.0 lb

## 2022-08-17 DIAGNOSIS — N920 Excessive and frequent menstruation with regular cycle: Secondary | ICD-10-CM | POA: Diagnosis not present

## 2022-08-17 DIAGNOSIS — E611 Iron deficiency: Secondary | ICD-10-CM | POA: Diagnosis not present

## 2022-08-17 LAB — CBC
HCT: 38.8 % (ref 36.0–46.0)
Hemoglobin: 12.9 g/dL (ref 12.0–15.0)
MCH: 28.2 pg (ref 26.0–34.0)
MCHC: 33.2 g/dL (ref 30.0–36.0)
MCV: 84.7 fL (ref 80.0–100.0)
Platelets: 269 10*3/uL (ref 150–400)
RBC: 4.58 MIL/uL (ref 3.87–5.11)
RDW: 14 % (ref 11.5–15.5)
WBC: 6.5 10*3/uL (ref 4.0–10.5)
nRBC: 0 % (ref 0.0–0.2)

## 2022-08-17 NOTE — Progress Notes (Signed)
Hematology/Oncology Progress note [phone] [phone]         Patient Care Team: Brynda Peon, MD as PCP - General (Family Medicine) Rickard Patience, MD as Consulting Physician (Oncology)   ASSESSMENT & PLAN:   Iron deficiency Labs are reviewed and discussed with patient. Persistent iron deficiency, despite oral iron supplementation.  Recommend IV venofer treatments.  Rationale and side effects have been discussed in details with patient and she agrees with the plan.  Recommend IV venofer weekly x4   Menorrhagia chronic issue for patient. normal von Willebrand disease panel   Orders Placed This Encounter  Procedures   CBC    Standing Status:   Future    Number of Occurrences:   1    Standing Expiration Date:   08/17/2023   Follow up in 4 months All questions were answered. The patient knows to call the clinic with any problems, questions or concerns.  Rickard Patience, MD, PhD [facility] Hematology Oncology 08/17/2022   CHIEF COMPLAINTS/REASON FOR VISIT:  Follow-up for iron deficiency   HISTORY OF PRESENTING ILLNESS:   [name] is a  41 y.o.  female with PMH listed below was seen in consultation at the request of  Orpha Bur, MD  for evaluation of Iron deficiency  Patient has had blood work done at primary care provider's office and the results were scanned into media.  05/21/2021, iron panel showed saturation of 27, TIBC 442, ferritin 12. 06/21/2021, CBC showed hemoglobin 13.5, hematocrit 41.3, WBC 5.1, platelet count 245,000. 07/08/2021, iron panel showed a TIBC of 425, ferritin 14, iron saturation 21. 10/22/2021, patient had a brain MRI without contrast done for work-up of paresthesia.  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  10/28/2021, iron  panel showed TIBC 368, iron saturation 21, ferritin 10. CBC showed a hemoglobin of 13.2, hematocrit 41.2, MCV 88.4.  Normal white count and platelet count.  Patient reports that she has been taking oral iron supplementation since she was informed that she has low iron level Initially she were taking Vitron C 2 tablets daily and she reports having upset stomach and constipation as side effects. Currently she takes 1 tablet of Vitron-C and another tablet of blood builder and feels the combination works better for her.  She reports a history of heavy menstrual bleeding.  Menstrual cycle  Is regular. Denies any black stool or bright red blood in the stool. Patient is in the process of having sleep apnea worked up, planned sleep study 12/31/2021.Marland Kitchen  Patient reports feeling tired.  She has chronic forearm and hand numbness and tingling.  she was diagnosed with sleep apnea and uses CPAP machine at night.   INTERVAL HISTORY [name] is a 42 y.o. female who has above history reviewed by me today presents for follow up visit for iron deficiency Patient uses CPAP machine and has felt sleeping quality better, she initially felt improved energy. Recently she feels fatigue is worse again.   Review of Systems  Constitutional:  Positive for fatigue. Negative for appetite change, chills and fever.  HENT:   Negative for hearing loss and voice change.   Eyes:  Negative for eye problems.  Respiratory:  Negative for chest tightness and cough.   Cardiovascular:  Negative for chest pain.  Gastrointestinal:  Negative for abdominal distention, abdominal pain  and blood in stool.  Endocrine: Negative for hot flashes.  Genitourinary:  Negative for difficulty urinating and frequency.   Musculoskeletal:  Negative for arthralgias.  Skin:  Negative for itching and rash.  Neurological:  Positive for numbness. Negative for extremity weakness.  Hematological:  Negative for adenopathy.  Psychiatric/Behavioral:  Negative  for confusion.     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    Paresthesia of upper limb     SURGICAL HISTORY: Past Surgical History:  Procedure Laterality Date   CESAREAN SECTION      SOCIAL HISTORY: Social History   Socioeconomic History   Marital status: Married    Spouse name: Not on file   Number of children: Not on file   Years of education: Not on file   Highest education level: Not on file  Occupational History   Not on file  Tobacco Use   Smoking status: Never   Smokeless tobacco: Never  Substance and Sexual Activity   Alcohol use: Yes   Drug use: Never   Sexual activity: Yes  Other Topics Concern   Not on file  Social History Narrative   Not on file   Social Determinants of Health   Financial Resource Strain: Not on file  Food Insecurity: Not on file  Transportation Needs: Not on file  Physical Activity: Not on file  Stress: Not on file  Social Connections: Not on file  Intimate Partner Violence: Not on file    FAMILY HISTORY: Family History  Problem Relation Age of Onset   Arthritis/Rheumatoid Mother    Diabetes Mellitus I Father    Breast cancer Sister    Stroke Maternal Grandmother    Throat cancer Maternal Grandfather    Lung cancer Paternal Grandmother    Heart attack Paternal Grandfather     ALLERGIES:  is allergic to sumatriptan.  MEDICATIONS:  Current Outpatient Medications  Medication Sig Dispense Refill   Ergocalciferol (VITAMIN D2 PO) TAKE 1 CAPSULE BY MOUTH ONCE WEEKLY FOR 8 WEEKS THEN 1 CAPSULE EVERY MONTH (Patient not taking: Reported on 08/17/2022)     Iron-Vitamin C 65-125 MG TABS Take by mouth once. (Patient not taking: Reported on 08/17/2022)     MAGNESIUM OXIDE 400 PO Take by mouth. (Patient not taking: Reported on 08/17/2022)     Multiple Vitamin (MULTIVITAMIN ADULT PO) Take 1 tablet by  mouth daily. MEGA FOODS: BLOODBUILDER (Patient not taking: Reported on 08/17/2022)     pyridoxine (B-6) 100 MG tablet Take 100 mg by mouth daily. (Patient not taking: Reported on 08/17/2022)     Semaglutide-Weight Management (WEGOVY) 0.25 MG/0.5ML SOAJ Inject by subcutaneous route for 28 days. (Patient not taking: Reported on 08/17/2022)     No current facility-administered medications for this visit.     PHYSICAL EXAMINATION: ECOG PERFORMANCE STATUS: 0 - Asymptomatic Vitals:   08/17/22 1438 08/17/22 1440  BP:  119/73  Pulse:  63  Resp: 18   SpO2:  100%   Filed Weights   08/17/22 1434  Weight: 234 lb (106.1 kg)    Physical Exam Constitutional:      General: She is not in acute distress. HENT:     Head: Normocephalic and atraumatic.  Eyes:     General: No scleral icterus. Cardiovascular:     Rate and Rhythm: Normal rate and regular rhythm.  Heart sounds: Normal heart sounds.  Pulmonary:     Effort: Pulmonary effort is normal. No respiratory distress.     Breath sounds: No wheezing.  Abdominal:     General: Bowel sounds are normal. There is no distension.     Palpations: Abdomen is soft.  Musculoskeletal:        General: No deformity. Normal range of motion.     Cervical back: Normal range of motion and neck supple.  Skin:    General: Skin is warm and dry.     Findings: No erythema or rash.  Neurological:     Mental Status: She is alert and oriented to person, place, and time. Mental status is at baseline.     Cranial Nerves: No cranial nerve deficit.     Coordination: Coordination normal.  Psychiatric:        Mood and Affect: Mood normal.     LABORATORY DATA:  I have reviewed the data as listed    Latest Ref Rng & Units 08/17/2022    3:42 PM 02/11/2022    9:42 AM 05/04/2007    5:25 AM  CBC  WBC 4.0 - 10.5 K/uL 6.5  5.7  12.6   Hemoglobin 12.0 - 15.0 g/dL 71.1  65.7  90.3   Hematocrit 36.0 - 46.0 % 38.8  39.6  33.6   Platelets 150 - 400 K/uL 269  245  205        Latest Ref Rng & Units 08/15/2022   10:31 AM  CMP  Glucose 70 - 99 mg/dL 95   BUN 6 - 20 mg/dL 12   Creatinine 8.33 - 1.00 mg/dL 3.83   Sodium 291 - 916 mmol/L 136   Potassium 3.5 - 5.1 mmol/L 4.5   Chloride 98 - 111 mmol/L 107   CO2 22 - 32 mmol/L 26   Calcium 8.9 - 10.3 mg/dL 8.7   Total Protein 6.5 - 8.1 g/dL 7.6   Total Bilirubin 0.3 - 1.2 mg/dL 0.5   Alkaline Phos 38 - 126 U/L 43   AST 15 - 41 U/L 19   ALT 0 - 44 U/L 19     Iron/TIBC/Ferritin/ %Sat    Component Value Date/Time   IRON 74 08/15/2022 1031   TIBC 482 (H) 08/15/2022 1031   FERRITIN 8 (L) 08/15/2022 1031   IRONPCTSAT 15 08/15/2022 1031      RADIOGRAPHIC STUDIES: I have personally reviewed the radiological images as listed and agreed with the findings in the report. No results found.

## 2022-08-17 NOTE — Assessment & Plan Note (Signed)
chronic issue for patient. normal von Willebrand disease panel

## 2022-08-17 NOTE — Assessment & Plan Note (Signed)
Labs are reviewed and discussed with patient. Persistent iron deficiency, despite oral iron supplementation.  Recommend IV venofer treatments.  Rationale and side effects have been discussed in details with patient and she agrees with the plan.  Recommend IV venofer weekly x4

## 2022-08-17 NOTE — Addendum Note (Signed)
Addended by: Earlie Server on: 08/17/2022 08:18 PM   Modules accepted: Orders

## 2022-08-22 ENCOUNTER — Inpatient Hospital Stay: Payer: 59 | Attending: Oncology

## 2022-08-22 ENCOUNTER — Other Ambulatory Visit: Payer: Self-pay

## 2022-08-22 ENCOUNTER — Inpatient Hospital Stay: Payer: 59

## 2022-08-22 VITALS — BP 117/76 | HR 68 | Temp 96.4°F | Resp 20 | Wt 235.5 lb

## 2022-08-22 DIAGNOSIS — N920 Excessive and frequent menstruation with regular cycle: Secondary | ICD-10-CM | POA: Insufficient documentation

## 2022-08-22 DIAGNOSIS — E611 Iron deficiency: Secondary | ICD-10-CM

## 2022-08-22 DIAGNOSIS — R5383 Other fatigue: Secondary | ICD-10-CM | POA: Diagnosis not present

## 2022-08-22 LAB — PREGNANCY, URINE: Preg Test, Ur: NEGATIVE

## 2022-08-22 MED ORDER — SODIUM CHLORIDE 0.9 % IV SOLN
200.0000 mg | Freq: Once | INTRAVENOUS | Status: AC
  Administered 2022-08-22: 200 mg via INTRAVENOUS
  Filled 2022-08-22: qty 200

## 2022-08-22 MED ORDER — SODIUM CHLORIDE 0.9 % IV SOLN
Freq: Once | INTRAVENOUS | Status: AC
  Filled 2022-08-22: qty 250

## 2022-08-22 NOTE — Patient Instructions (Signed)
[facility]  Discharge Instructions: Thank you for choosing [facility] to provide your oncology and hematology care.  If you have a lab appointment with the Cancer Center, please go directly to the Cancer Center and check in at the registration area.  Wear comfortable clothing and clothing appropriate for easy access to any Portacath or PICC line.   We strive to give you quality time with your provider. You may need to reschedule your appointment if you arrive late (15 or more minutes).  Arriving late affects you and other patients whose appointments are after yours.  Also, if you miss three or more appointments without notifying the office, you may be dismissed from the clinic at the provider's discretion.      For prescription refill requests, have your pharmacy contact our office and allow 72 hours for refills to be completed.       To help prevent nausea and vomiting after your treatment, we encourage you to take your nausea medication as directed.  BELOW ARE SYMPTOMS THAT SHOULD BE REPORTED IMMEDIATELY: *FEVER GREATER THAN 100.4 F (38 C) OR HIGHER *CHILLS OR SWEATING *NAUSEA AND VOMITING THAT IS NOT CONTROLLED WITH YOUR NAUSEA MEDICATION *UNUSUAL SHORTNESS OF BREATH *UNUSUAL BRUISING OR BLEEDING *URINARY PROBLEMS (pain or burning when urinating, or frequent urination) *BOWEL PROBLEMS (unusual diarrhea, constipation, pain near the anus) TENDERNESS IN MOUTH AND THROAT WITH OR WITHOUT PRESENCE OF ULCERS (sore throat, sores in mouth, or a toothache) UNUSUAL RASH, SWELLING OR PAIN  UNUSUAL VAGINAL DISCHARGE OR ITCHING   Items with * indicate a potential emergency and should be followed up as soon as possible or go to the Emergency Department if any problems should occur.  Please show the CHEMOTHERAPY ALERT CARD or IMMUNOTHERAPY ALERT CARD at check-in to the Emergency Department and triage nurse.  Should you have questions after your  visit or need to cancel or reschedule your appointment, please contact [facility]  [phone] and follow the prompts.  Office hours are 8:00 a.m. to 4:30 p.m. Monday - Friday. Please note that voicemails left after 4:00 p.m. may not be returned until the following business day.  We are closed weekends and major holidays. You have access to a nurse at all times for urgent questions. Please call the main number to the clinic [phone] and follow the prompts.  For any non-urgent questions, you may also contact your provider using MyChart. We now offer e-Visits for anyone 18 and older to request care online for non-urgent symptoms. For details visit [email or web address].   Also download the MyChart app! Go to the app store, search "MyChart", open the app, select [facility], and log in with your MyChart username and password.  Masks are optional in the cancer centers. If you would like for your care team to wear a mask while they are taking care of you, please let them know. For doctor visits, patients may have with them one support person who is at least 42 years old. At this time, visitors are not allowed in the infusion area.   

## 2022-09-05 ENCOUNTER — Inpatient Hospital Stay: Payer: 59

## 2022-09-05 DIAGNOSIS — E611 Iron deficiency: Secondary | ICD-10-CM

## 2022-09-05 LAB — PREGNANCY, URINE: Preg Test, Ur: NEGATIVE

## 2022-09-05 MED ORDER — SODIUM CHLORIDE 0.9 % IV SOLN
200.0000 mg | Freq: Once | INTRAVENOUS | Status: AC
  Administered 2022-09-05: 200 mg via INTRAVENOUS
  Filled 2022-09-05: qty 200

## 2022-09-05 MED ORDER — SODIUM CHLORIDE 0.9 % IV SOLN
Freq: Once | INTRAVENOUS | Status: AC
  Filled 2022-09-05: qty 250

## 2022-09-12 ENCOUNTER — Inpatient Hospital Stay: Payer: 59

## 2022-09-12 VITALS — BP 120/79 | HR 61 | Temp 96.1°F | Resp 18

## 2022-09-12 DIAGNOSIS — E611 Iron deficiency: Secondary | ICD-10-CM

## 2022-09-12 LAB — PREGNANCY, URINE: Preg Test, Ur: NEGATIVE

## 2022-09-12 MED ORDER — SODIUM CHLORIDE 0.9 % IV SOLN
Freq: Once | INTRAVENOUS | Status: AC
  Filled 2022-09-12: qty 250

## 2022-09-12 MED ORDER — SODIUM CHLORIDE 0.9 % IV SOLN
200.0000 mg | Freq: Once | INTRAVENOUS | Status: AC
  Administered 2022-09-12: 200 mg via INTRAVENOUS
  Filled 2022-09-12: qty 200

## 2022-09-19 ENCOUNTER — Ambulatory Visit: Payer: 59

## 2022-09-19 ENCOUNTER — Other Ambulatory Visit: Payer: 59

## 2022-09-21 ENCOUNTER — Inpatient Hospital Stay: Payer: 59 | Attending: Oncology

## 2022-09-21 ENCOUNTER — Inpatient Hospital Stay: Payer: 59

## 2022-09-21 VITALS — BP 135/77 | HR 64 | Temp 97.9°F | Resp 19

## 2022-09-21 DIAGNOSIS — E611 Iron deficiency: Secondary | ICD-10-CM | POA: Insufficient documentation

## 2022-09-21 DIAGNOSIS — N920 Excessive and frequent menstruation with regular cycle: Secondary | ICD-10-CM | POA: Diagnosis present

## 2022-09-21 LAB — PREGNANCY, URINE: Preg Test, Ur: NEGATIVE

## 2022-09-21 MED ORDER — SODIUM CHLORIDE 0.9 % IV SOLN
200.0000 mg | Freq: Once | INTRAVENOUS | Status: AC
  Administered 2022-09-21: 200 mg via INTRAVENOUS
  Filled 2022-09-21: qty 200

## 2022-09-21 MED ORDER — SODIUM CHLORIDE 0.9 % IV SOLN
Freq: Once | INTRAVENOUS | Status: AC
  Filled 2022-09-21: qty 250

## 2022-09-21 NOTE — Patient Instructions (Signed)
[facility]  Discharge Instructions: Thank you for choosing [facility] to provide your oncology and hematology care.  If you have a lab appointment with the Cancer Center, please go directly to the Cancer Center and check in at the registration area.  Wear comfortable clothing and clothing appropriate for easy access to any Portacath or PICC line.   We strive to give you quality time with your provider. You may need to reschedule your appointment if you arrive late (15 or more minutes).  Arriving late affects you and other patients whose appointments are after yours.  Also, if you miss three or more appointments without notifying the office, you may be dismissed from the clinic at the provider's discretion.      For prescription refill requests, have your pharmacy contact our office and allow 72 hours for refills to be completed.    Today you received the following chemotherapy and/or immunotherapy agents venofer      To help prevent nausea and vomiting after your treatment, we encourage you to take your nausea medication as directed.  BELOW ARE SYMPTOMS THAT SHOULD BE REPORTED IMMEDIATELY: *FEVER GREATER THAN 100.4 F (38 C) OR HIGHER *CHILLS OR SWEATING *NAUSEA AND VOMITING THAT IS NOT CONTROLLED WITH YOUR NAUSEA MEDICATION *UNUSUAL SHORTNESS OF BREATH *UNUSUAL BRUISING OR BLEEDING *URINARY PROBLEMS (pain or burning when urinating, or frequent urination) *BOWEL PROBLEMS (unusual diarrhea, constipation, pain near the anus) TENDERNESS IN MOUTH AND THROAT WITH OR WITHOUT PRESENCE OF ULCERS (sore throat, sores in mouth, or a toothache) UNUSUAL RASH, SWELLING OR PAIN  UNUSUAL VAGINAL DISCHARGE OR ITCHING   Items with * indicate a potential emergency and should be followed up as soon as possible or go to the Emergency Department if any problems should occur.  Please show the CHEMOTHERAPY ALERT CARD or IMMUNOTHERAPY ALERT CARD at check-in to the  Emergency Department and triage nurse.  Should you have questions after your visit or need to cancel or reschedule your appointment, please contact [facility]  [phone] and follow the prompts.  Office hours are 8:00 a.m. to 4:30 p.m. Monday - Friday. Please note that voicemails left after 4:00 p.m. may not be returned until the following business day.  We are closed weekends and major holidays. You have access to a nurse at all times for urgent questions. Please call the main number to the clinic [phone] and follow the prompts.  For any non-urgent questions, you may also contact your provider using MyChart. We now offer e-Visits for anyone 18 and older to request care online for non-urgent symptoms. For details visit [email or web address].   Also download the MyChart app! Go to the app store, search "MyChart", open the app, select [facility], and log in with your MyChart username and password.  Masks are optional in the cancer centers. If you would like for your care team to wear a mask while they are taking care of you, please let them know. For doctor visits, patients may have with them one support person who is at least 42 years old. At this time, visitors are not allowed in the infusion area.   

## 2022-12-09 ENCOUNTER — Inpatient Hospital Stay: Payer: 59 | Attending: Oncology

## 2022-12-09 DIAGNOSIS — N920 Excessive and frequent menstruation with regular cycle: Secondary | ICD-10-CM | POA: Insufficient documentation

## 2022-12-09 DIAGNOSIS — E611 Iron deficiency: Secondary | ICD-10-CM | POA: Diagnosis present

## 2022-12-09 LAB — CBC WITH DIFFERENTIAL/PLATELET
Abs Immature Granulocytes: 0.01 10*3/uL (ref 0.00–0.07)
Basophils Absolute: 0 10*3/uL (ref 0.0–0.1)
Basophils Relative: 1 %
Eosinophils Absolute: 0.1 10*3/uL (ref 0.0–0.5)
Eosinophils Relative: 1 %
HCT: 39.4 % (ref 36.0–46.0)
Hemoglobin: 13 g/dL (ref 12.0–15.0)
Immature Granulocytes: 0 %
Lymphocytes Relative: 31 %
Lymphs Abs: 1.9 10*3/uL (ref 0.7–4.0)
MCH: 28.8 pg (ref 26.0–34.0)
MCHC: 33 g/dL (ref 30.0–36.0)
MCV: 87.4 fL (ref 80.0–100.0)
Monocytes Absolute: 0.3 10*3/uL (ref 0.1–1.0)
Monocytes Relative: 6 %
Neutro Abs: 3.8 10*3/uL (ref 1.7–7.7)
Neutrophils Relative %: 61 %
Platelets: 302 10*3/uL (ref 150–400)
RBC: 4.51 MIL/uL (ref 3.87–5.11)
RDW: 13.4 % (ref 11.5–15.5)
WBC: 6.2 10*3/uL (ref 4.0–10.5)
nRBC: 0 % (ref 0.0–0.2)

## 2022-12-09 LAB — FERRITIN: Ferritin: 23 ng/mL (ref 11–307)

## 2022-12-09 LAB — PREGNANCY, URINE: Preg Test, Ur: NEGATIVE

## 2022-12-09 LAB — IRON AND TIBC
Iron: 43 ug/dL (ref 28–170)
Saturation Ratios: 10 % — ABNORMAL LOW (ref 10.4–31.8)
TIBC: 416 ug/dL (ref 250–450)
UIBC: 373 ug/dL

## 2022-12-14 ENCOUNTER — Encounter: Payer: Self-pay | Admitting: Oncology

## 2022-12-14 ENCOUNTER — Inpatient Hospital Stay: Payer: 59 | Admitting: Oncology

## 2022-12-14 ENCOUNTER — Inpatient Hospital Stay: Payer: 59

## 2022-12-14 VITALS — BP 107/71 | HR 78 | Temp 96.0°F | Wt 232.0 lb

## 2022-12-14 VITALS — BP 134/73 | HR 60 | Resp 16

## 2022-12-14 DIAGNOSIS — E611 Iron deficiency: Secondary | ICD-10-CM

## 2022-12-14 DIAGNOSIS — N921 Excessive and frequent menstruation with irregular cycle: Secondary | ICD-10-CM

## 2022-12-14 MED ORDER — SODIUM CHLORIDE 0.9 % IV SOLN
Freq: Once | INTRAVENOUS | Status: AC
  Filled 2022-12-14: qty 250

## 2022-12-14 MED ORDER — SODIUM CHLORIDE 0.9 % IV SOLN
200.0000 mg | Freq: Once | INTRAVENOUS | Status: AC
  Administered 2022-12-14: 200 mg via INTRAVENOUS
  Filled 2022-12-14: qty 200

## 2022-12-14 NOTE — Progress Notes (Signed)
Hematology/Oncology Progress note [phone] [phone]         Patient Care Team: Legrand Rams, MD as PCP - General (Family Medicine) Earlie Server, MD as Consulting Physician (Oncology)   ASSESSMENT & PLAN:   Iron deficiency Labs are reviewed and discussed with patient. Hemoglobin remains normal, iron panel has improved.  Given that she continues to have ongoing blood loss from irregular heavy menstrual period, I recommend additional IV Venofer x 2 doses.  After that, recommend patient to take oral iron supplementation as maintenance.  Menorrhagia Gyn evaluation.    Orders Placed This Encounter  Procedures   CBC with Differential/Platelet    Standing Status:   Future    Standing Expiration Date:   12/15/2023   Iron and TIBC    Standing Status:   Future    Standing Expiration Date:   12/15/2023   Ferritin    Standing Status:   Future    Standing Expiration Date:   12/15/2023   Follow up in 4 months All questions were answered. The patient knows to call the clinic with any problems, questions or concerns.  Earlie Server, MD, PhD [facility] Hematology Oncology 12/14/2022   CHIEF COMPLAINTS/REASON FOR VISIT:  Follow-up for iron deficiency   HISTORY OF PRESENTING ILLNESS:   [name] is a  42 y.o.  female with PMH listed below was seen in consultation at the request of  Legrand Rams, MD  for evaluation of Iron deficiency  Patient has had blood work done at primary care provider's office and the results were scanned into media.  05/21/2021, iron panel showed saturation of 27, TIBC 442, ferritin 12. 06/21/2021, CBC showed hemoglobin 13.5, hematocrit 41.3, WBC 5.1, platelet count 245,000. 07/08/2021, iron panel showed a TIBC of 425, ferritin 14, iron saturation 21. 10/22/2021, patient had a brain MRI without contrast done for work-up of paresthesia.  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  10/28/2021, iron panel showed TIBC 368, iron saturation 21, ferritin 10. CBC showed a hemoglobin of 13.2, hematocrit 41.2, MCV 88.4.  Normal white count and platelet count.  Patient reports that she has been taking oral iron supplementation since she was informed that she has low iron level Initially she were taking Vitron C 2 tablets daily and she reports having upset stomach and constipation as side effects. Currently she takes 1 tablet of Vitron-C and another tablet of blood builder and feels the combination works better for her.  She reports a history of heavy menstrual bleeding.  Menstrual cycle  Is regular. Denies any black stool or bright red blood in the stool. Patient is in the process of having sleep apnea worked up, planned sleep study 12/31/2021.Marland Kitchen  Patient reports feeling tired.  She has chronic forearm and hand numbness and tingling.  she was diagnosed with sleep apnea and uses CPAP machine at night.   INTERVAL HISTORY [name] is a 42 y.o. female who has above history reviewed by me today presents for follow up visit for iron deficiency Patient feels improvement of energy level after iron infusion.  Recently patient had irregular menstrual period. heavy flow  Review of Systems  Constitutional:  Positive for fatigue. Negative for appetite change, chills and fever.  HENT:   Negative for hearing loss and voice change.   Eyes:  Negative for eye problems.  Respiratory:  Negative for chest tightness and cough.   Cardiovascular:  Negative for chest pain.  Gastrointestinal:  Negative for abdominal distention, abdominal pain and blood in stool.  Endocrine: Negative for hot flashes.  Genitourinary:  Negative for difficulty urinating and frequency.   Musculoskeletal:  Negative for arthralgias.  Skin:  Negative for itching and rash.   Neurological:  Negative for extremity weakness and numbness.  Hematological:  Negative for adenopathy.  Psychiatric/Behavioral:  Negative for confusion.     MEDICAL HISTORY:    SURGICAL HISTORY: Past Surgical History:  Procedure Laterality Date   CESAREAN SECTION      SOCIAL HISTORY: Social History   Socioeconomic History   Marital status: Married    Spouse name: Not on file   Number of children: Not on file   Years of education: Not on file   Highest education level: Not on file  Occupational History   Not on file  Tobacco Use   Smoking status: Never   Smokeless tobacco: Never  Substance and Sexual Activity   Alcohol use: Yes   Drug use: Never   Sexual activity: Yes  Other Topics Concern   Not on file  Social History Narrative   Not on file   Social Determinants of Health   Financial Resource Strain: Not on file  Food Insecurity: Not on file  Transportation Needs: Not on file  Physical Activity: Not on file  Stress: Not on file  Social Connections: Not on file  Intimate Partner Violence: Not on file    FAMILY HISTORY: Family History  Problem Relation Age of Onset   Arthritis/Rheumatoid Mother    Diabetes Mellitus I Father    Breast cancer Sister    Stroke Maternal Grandmother    Throat cancer Maternal Grandfather    Lung cancer Paternal Grandmother    Heart attack Paternal Grandfather     ALLERGIES:  is allergic to sumatriptan.  MEDICATIONS:  Current Outpatient Medications  Medication Sig Dispense Refill   Ergocalciferol (VITAMIN D2 PO) TAKE 1 CAPSULE BY MOUTH ONCE WEEKLY FOR 8 WEEKS THEN 1 CAPSULE EVERY MONTH (Patient not taking: Reported on 08/17/2022)     Iron-Vitamin C 65-125 MG TABS Take by mouth once. (Patient not taking: Reported on 08/17/2022)     MAGNESIUM OXIDE 400 PO Take by mouth. (Patient not taking: Reported on 08/17/2022)     Multiple Vitamin (MULTIVITAMIN ADULT PO) Take 1 tablet by mouth daily. MEGA FOODS: BLOODBUILDER (Patient not  taking: Reported on 08/17/2022)     pyridoxine (B-6) 100 MG tablet Take 100 mg by mouth daily. (Patient not taking: Reported on 12/14/2022)     Semaglutide-Weight Management (WEGOVY) 0.25 MG/0.5ML SOAJ Inject by subcutaneous route for 28 days. (Patient not taking: Reported on 12/14/2022)     No current facility-administered medications for this visit.     PHYSICAL EXAMINATION: ECOG PERFORMANCE STATUS: 0 - Asymptomatic Vitals:   12/14/22 1340  BP: 107/71  Pulse: 78  Temp: (!) 96 F (35.6 C)  SpO2: 98%   Filed Weights   12/14/22 1340  Weight: 232 lb (105.2 kg)    Physical Exam Constitutional:      General: She is not in acute distress. HENT:     Head: Normocephalic and atraumatic.  Eyes:     General: No scleral icterus. Cardiovascular:     Rate and Rhythm: Normal rate and regular rhythm.     Heart sounds: Normal heart sounds.  Pulmonary:  Effort: Pulmonary effort is normal. No respiratory distress.     Breath sounds: No wheezing.  Abdominal:     General: Bowel sounds are normal. There is no distension.     Palpations: Abdomen is soft.  Musculoskeletal:        General: No deformity. Normal range of motion.     Cervical back: Normal range of motion and neck supple.  Skin:    General: Skin is warm and dry.     Findings: No erythema or rash.  Neurological:     Mental Status: She is alert and oriented to person, place, and time. Mental status is at baseline.     Cranial Nerves: No cranial nerve deficit.     Coordination: Coordination normal.  Psychiatric:        Mood and Affect: Mood normal.     LABORATORY DATA:  I have reviewed the data as listed    Latest Ref Rng & Units 12/09/2022   11:52 AM 08/17/2022    3:42 PM 02/11/2022    9:42 AM  CBC  WBC 4.0 - 10.5 K/uL 6.2  6.5  5.7   Hemoglobin 12.0 - 15.0 g/dL 36.1  44.3  15.4   Hematocrit 36.0 - 46.0 % 39.4  38.8  39.6   Platelets 150 - 400 K/uL 302  269  245       Latest Ref Rng & Units 08/15/2022   10:31 AM   CMP  Glucose 70 - 99 mg/dL 95   BUN 6 - 20 mg/dL 12   Creatinine 0.08 - 1.00 mg/dL 6.76   Sodium 195 - 093 mmol/L 136   Potassium 3.5 - 5.1 mmol/L 4.5   Chloride 98 - 111 mmol/L 107   CO2 22 - 32 mmol/L 26   Calcium 8.9 - 10.3 mg/dL 8.7   Total Protein 6.5 - 8.1 g/dL 7.6   Total Bilirubin 0.3 - 1.2 mg/dL 0.5   Alkaline Phos 38 - 126 U/L 43   AST 15 - 41 U/L 19   ALT 0 - 44 U/L 19     Iron/TIBC/Ferritin/ %Sat    Component Value Date/Time   IRON 43 12/09/2022 1152   TIBC 416 12/09/2022 1152   FERRITIN 23 12/09/2022 1152   IRONPCTSAT 10 (L) 12/09/2022 1152      RADIOGRAPHIC STUDIES: I have personally reviewed the radiological images as listed and agreed with the findings in the report. No results found.

## 2022-12-14 NOTE — Patient Instructions (Signed)
Iron Sucrose Injection What is this medication? IRON SUCROSE (EYE ern SOO krose) treats low levels of iron (iron deficiency anemia) in people with kidney disease. Iron is a mineral that plays an important role in making red blood cells, which carry oxygen from your lungs to the rest of your body. This medicine may be used for other purposes; ask your health care provider or pharmacist if you have questions. COMMON BRAND NAME(S): Venofer What should I tell my care team before I take this medication? They need to know if you have any of these conditions: Anemia not caused by low iron levels Heart disease High levels of iron in the blood Kidney disease Liver disease An unusual or allergic reaction to iron, other medications, foods, dyes, or preservatives Pregnant or trying to get pregnant Breastfeeding How should I use this medication? This medication is for infusion into a vein. It is given in a hospital or clinic setting. Talk to your care team about the use of this medication in children. While this medication may be prescribed for children as young as 2 years for selected conditions, precautions do apply. Overdosage: If you think you have taken too much of this medicine contact a poison control center or emergency room at once. NOTE: This medicine is only for you. Do not share this medicine with others. What if I miss a dose? Keep appointments for follow-up doses. It is important not to miss your dose. Call your care team if you are unable to keep an appointment. What may interact with this medication? Do not take this medication with any of the following: Deferoxamine Dimercaprol Other iron products This medication may also interact with the following: Chloramphenicol Deferasirox This list may not describe all possible interactions. Give your health care provider a list of all the medicines, herbs, non-prescription drugs, or dietary supplements you use. Also tell them if you smoke,  drink alcohol, or use illegal drugs. Some items may interact with your medicine. What should I watch for while using this medication? Visit your care team regularly. Tell your care team if your symptoms do not start to get better or if they get worse. You may need blood work done while you are taking this medication. You may need to follow a special diet. Talk to your care team. Foods that contain iron include: whole grains/cereals, dried fruits, beans, or peas, leafy green vegetables, and organ meats (liver, kidney). What side effects may I notice from receiving this medication? Side effects that you should report to your care team as soon as possible: Allergic reactions--skin rash, itching, hives, swelling of the face, lips, tongue, or throat Low blood pressure--dizziness, feeling faint or lightheaded, blurry vision Shortness of breath Side effects that usually do not require medical attention (report to your care team if they continue or are bothersome): Flushing Headache Joint pain Muscle pain Nausea Pain, redness, or irritation at injection site This list may not describe all possible side effects. Call your doctor for medical advice about side effects. You may report side effects to FDA at 1-800-FDA-1088. Where should I keep my medication? This medication is given in a hospital or clinic and will not be stored at home. NOTE: This sheet is a summary. It may not cover all possible information. If you have questions about this medicine, talk to your doctor, pharmacist, or health care provider.  2023 Elsevier/Gold Standard (2021-02-18 00:00:00)

## 2022-12-14 NOTE — Assessment & Plan Note (Addendum)
Labs are reviewed and discussed with patient. Hemoglobin remains normal, iron panel has improved.  Given that she continues to have ongoing blood loss from irregular heavy menstrual period, I recommend additional IV Venofer x 2 doses.  After that, recommend patient to take oral iron supplementation as maintenance.

## 2022-12-14 NOTE — Progress Notes (Signed)
Pt tolerated iron infusion without complaints.  Pt refused 30 minute post observation.  VSS.

## 2022-12-14 NOTE — Assessment & Plan Note (Signed)
Gyn evaluation.

## 2022-12-28 ENCOUNTER — Inpatient Hospital Stay: Payer: 59 | Attending: Oncology

## 2022-12-28 VITALS — BP 130/76 | HR 70 | Temp 97.6°F

## 2022-12-28 DIAGNOSIS — E611 Iron deficiency: Secondary | ICD-10-CM | POA: Insufficient documentation

## 2022-12-28 DIAGNOSIS — N920 Excessive and frequent menstruation with regular cycle: Secondary | ICD-10-CM | POA: Insufficient documentation

## 2022-12-28 MED ORDER — SODIUM CHLORIDE 0.9 % IV SOLN
200.0000 mg | Freq: Once | INTRAVENOUS | Status: AC
  Administered 2022-12-28: 200 mg via INTRAVENOUS
  Filled 2022-12-28: qty 200

## 2022-12-28 MED ORDER — SODIUM CHLORIDE 0.9 % IV SOLN
INTRAVENOUS | Status: DC | PRN
  Filled 2022-12-28: qty 250

## 2023-04-14 ENCOUNTER — Inpatient Hospital Stay: Payer: 59 | Attending: Oncology

## 2023-04-14 DIAGNOSIS — E611 Iron deficiency: Secondary | ICD-10-CM | POA: Insufficient documentation

## 2023-04-14 DIAGNOSIS — N920 Excessive and frequent menstruation with regular cycle: Secondary | ICD-10-CM | POA: Insufficient documentation

## 2023-04-14 LAB — CBC WITH DIFFERENTIAL/PLATELET
Abs Immature Granulocytes: 0.02 10*3/uL (ref 0.00–0.07)
Basophils Absolute: 0 10*3/uL (ref 0.0–0.1)
Basophils Relative: 0 %
Eosinophils Absolute: 0.1 10*3/uL (ref 0.0–0.5)
Eosinophils Relative: 1 %
HCT: 39.8 % (ref 36.0–46.0)
Hemoglobin: 12.8 g/dL (ref 12.0–15.0)
Immature Granulocytes: 0 %
Lymphocytes Relative: 28 %
Lymphs Abs: 2 10*3/uL (ref 0.7–4.0)
MCH: 28.5 pg (ref 26.0–34.0)
MCHC: 32.2 g/dL (ref 30.0–36.0)
MCV: 88.6 fL (ref 80.0–100.0)
Monocytes Absolute: 0.4 10*3/uL (ref 0.1–1.0)
Monocytes Relative: 6 %
Neutro Abs: 4.7 10*3/uL (ref 1.7–7.7)
Neutrophils Relative %: 65 %
Platelets: 288 10*3/uL (ref 150–400)
RBC: 4.49 MIL/uL (ref 3.87–5.11)
RDW: 13.2 % (ref 11.5–15.5)
WBC: 7.3 10*3/uL (ref 4.0–10.5)
nRBC: 0 % (ref 0.0–0.2)

## 2023-04-14 LAB — IRON AND TIBC
Iron: 66 ug/dL (ref 28–170)
Saturation Ratios: 16 % (ref 10.4–31.8)
TIBC: 405 ug/dL (ref 250–450)
UIBC: 339 ug/dL

## 2023-04-14 LAB — FERRITIN: Ferritin: 19 ng/mL (ref 11–307)

## 2023-04-19 ENCOUNTER — Inpatient Hospital Stay: Payer: 59

## 2023-04-19 ENCOUNTER — Encounter: Payer: Self-pay | Admitting: Oncology

## 2023-04-19 ENCOUNTER — Inpatient Hospital Stay (HOSPITAL_BASED_OUTPATIENT_CLINIC_OR_DEPARTMENT_OTHER): Payer: 59 | Admitting: Oncology

## 2023-04-19 VITALS — BP 120/79 | HR 85 | Temp 98.7°F | Wt 240.5 lb

## 2023-04-19 VITALS — BP 125/71 | HR 64

## 2023-04-19 DIAGNOSIS — E611 Iron deficiency: Secondary | ICD-10-CM

## 2023-04-19 LAB — PREGNANCY, URINE: Preg Test, Ur: NEGATIVE

## 2023-04-19 MED ORDER — SODIUM CHLORIDE 0.9 % IV SOLN
200.0000 mg | Freq: Once | INTRAVENOUS | Status: AC
  Administered 2023-04-19: 200 mg via INTRAVENOUS
  Filled 2023-04-19: qty 200

## 2023-04-19 MED ORDER — SODIUM CHLORIDE 0.9 % IV SOLN
INTRAVENOUS | Status: DC
  Filled 2023-04-19: qty 250

## 2023-04-19 NOTE — Progress Notes (Signed)
Patient declined to wait the 30 minutes for post iron infusion observation today. Tolerated infusion well. VSS. 

## 2023-04-19 NOTE — Progress Notes (Signed)
Hematology/Oncology Progress note [phone] [phone]         Patient Care Team: Brynda Peon, MD as PCP - General (Family Medicine) Rickard Patience, MD as Consulting Physician (Oncology)   ASSESSMENT & PLAN:   Iron deficiency Labs are reviewed and discussed with patient. Hemoglobin remains normal, iron panel has improved.  Given that she continues to have ongoing blood loss from irregular heavy menstrual period I recommend additional IV Venofer x 1.  After that, recommend patient to take oral iron supplementation as maintenance.   Orders Placed This Encounter  Procedures   Pregnancy, urine    STAT    Standing Status:   Future    Number of Occurrences:   1    Standing Expiration Date:   04/18/2024   CBC with Differential (Cancer Center Only)    Standing Status:   Future    Standing Expiration Date:   04/18/2024   Iron and TIBC    Standing Status:   Future    Standing Expiration Date:   04/18/2024   Ferritin    Standing Status:   Future    Standing Expiration Date:   04/18/2024   Retic Panel    Standing Status:   Future    Standing Expiration Date:   04/18/2024   Follow up in 6 months All questions were answered. The patient knows to call the clinic with any problems, questions or concerns.  Rickard Patience, MD, PhD [facility] Hematology Oncology 04/19/2023   CHIEF COMPLAINTS/REASON FOR VISIT:  Follow-up for iron deficiency   HISTORY OF PRESENTING ILLNESS:   [name] is a  42 y.o.  female with PMH listed below was seen in consultation at the request of  Brynda Peon, MD  for evaluation of Iron deficiency  Patient has had blood work done at primary care provider's office and the results were scanned into media.  05/21/2021, iron panel showed saturation of 27, TIBC 442, ferritin 12. 06/21/2021, CBC showed hemoglobin 13.5, hematocrit 41.3, WBC 5.1, platelet count 245,000. 07/08/2021, iron panel showed a TIBC of 425, ferritin 14, iron saturation  21. 10/22/2021, patient had a brain MRI without contrast done for work-up of paresthesia.  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  10/28/2021, iron panel showed TIBC 368, iron saturation 21, ferritin 10. CBC showed a hemoglobin of 13.2, hematocrit 41.2, MCV 88.4.  Normal white count and platelet count.  Patient reports that she has been taking oral iron supplementation since she was informed that she has low iron level Initially she were taking Vitron C 2 tablets daily and she reports having upset stomach and constipation as side effects. Currently she takes 1 tablet of Vitron-C and another tablet of blood builder and feels the combination works better for her.  She reports a history of heavy menstrual bleeding.  Menstrual cycle  Is regular. Denies any black stool or bright red blood in the stool. Patient is in the process of having sleep apnea worked up, planned sleep study 12/31/2021.Marland Kitchen  Patient reports feeling tired.  She has chronic forearm and hand numbness and tingling.  she was diagnosed with sleep apnea and uses CPAP machine at night.   INTERVAL HISTORY [name] is a 42 y.o. female who has above history reviewed by me today presents for follow up visit for  iron deficiency Patient feels less fatigued.  Continues to have heavy menstrual bleeding. Review of Systems  Constitutional:  Positive for fatigue. Negative for appetite change, chills and fever.  HENT:   Negative for hearing loss and voice change.   Eyes:  Negative for eye problems.  Respiratory:  Negative for chest tightness and cough.   Cardiovascular:  Negative for chest pain.  Gastrointestinal:  Negative for abdominal distention, abdominal pain and blood in stool.  Endocrine: Negative for hot flashes.  Genitourinary:   Negative for difficulty urinating and frequency.   Musculoskeletal:  Negative for arthralgias.  Skin:  Negative for itching and rash.  Neurological:  Negative for extremity weakness and numbness.  Hematological:  Negative for adenopathy.  Psychiatric/Behavioral:  Negative for confusion.     MEDICAL HISTORY:    SURGICAL HISTORY: Past Surgical History:  Procedure Laterality Date   CESAREAN SECTION      SOCIAL HISTORY: Social History   Socioeconomic History   Marital status: Married    Spouse name: Not on file   Number of children: Not on file   Years of education: Not on file   Highest education level: Not on file  Occupational History   Not on file  Tobacco Use   Smoking status: Never   Smokeless tobacco: Never  Substance and Sexual Activity   Alcohol use: Yes   Drug use: Never   Sexual activity: Yes  Other Topics Concern   Not on file  Social History Narrative   Not on file   Social Determinants of Health   Financial Resource Strain: Not on file  Food Insecurity: Not on file  Transportation Needs: Not on file  Physical Activity: Not on file  Stress: Not on file  Social Connections: Not on file  Intimate Partner Violence: Not on file    FAMILY HISTORY: Family History  Problem Relation Age of Onset   Arthritis/Rheumatoid Mother    Diabetes Mellitus I Father    Breast cancer Sister    Stroke Maternal Grandmother    Throat cancer Maternal Grandfather    Lung cancer Paternal Grandmother    Heart attack Paternal Grandfather     ALLERGIES:  is allergic to sumatriptan.  MEDICATIONS:  Current Outpatient Medications  Medication Sig Dispense Refill   Ergocalciferol (VITAMIN D2 PO) TAKE 1 CAPSULE BY MOUTH ONCE WEEKLY FOR 8 WEEKS THEN 1 CAPSULE EVERY MONTH (Patient not taking: Reported on 08/17/2022)     Iron-Vitamin C 65-125 MG TABS Take by mouth once. (Patient not taking: Reported on 08/17/2022)     MAGNESIUM OXIDE 400 PO Take by mouth. (Patient not taking:  Reported on 08/17/2022)     Multiple Vitamin (MULTIVITAMIN ADULT PO) Take 1 tablet by mouth daily. MEGA FOODS: BLOODBUILDER (Patient not taking: Reported on 08/17/2022)     pyridoxine (B-6) 100 MG tablet Take 100 mg by mouth daily. (Patient not taking: Reported on 12/14/2022)     Semaglutide-Weight Management (WEGOVY) 0.25 MG/0.5ML SOAJ Inject by subcutaneous route for 28 days. (Patient not taking: Reported on 12/14/2022)     No current facility-administered medications for this visit.   Facility-Administered Medications Ordered in Other Visits  Medication Dose Route Frequency Provider Last Rate Last Admin   0.9 %  sodium chloride infusion   Intravenous Continuous Rickard Patience, MD   Stopped at 04/19/23 1603     PHYSICAL EXAMINATION: ECOG PERFORMANCE STATUS: 0 - Asymptomatic Vitals:   04/19/23 1448  BP: 120/79  Pulse: 85  Temp: 98.7 F (37.1 C)  SpO2: 100%   Filed Weights   04/19/23 1448  Weight: 240 lb 8 oz (109.1 kg)    Physical Exam Constitutional:      General: She is not in acute distress. HENT:     Head: Normocephalic and atraumatic.  Eyes:     General: No scleral icterus. Cardiovascular:     Rate and Rhythm: Normal rate and regular rhythm.     Heart sounds: Normal heart sounds.  Pulmonary:     Effort: Pulmonary effort is normal. No respiratory distress.     Breath sounds: No wheezing.  Abdominal:     General: Bowel sounds are normal. There is no distension.     Palpations: Abdomen is soft.  Musculoskeletal:        General: No deformity. Normal range of motion.     Cervical back: Normal range of motion and neck supple.  Skin:    General: Skin is warm and dry.     Findings: No erythema or rash.  Neurological:     Mental Status: She is alert and oriented to person, place, and time. Mental status is at baseline.     Cranial Nerves: No cranial nerve deficit.     Coordination: Coordination normal.  Psychiatric:        Mood and Affect: Mood normal.     LABORATORY  DATA:  I have reviewed the data as listed    Latest Ref Rng & Units 04/14/2023   11:13 AM 12/09/2022   11:52 AM 08/17/2022    3:42 PM  CBC  WBC 4.0 - 10.5 K/uL 7.3  6.2  6.5   Hemoglobin 12.0 - 15.0 g/dL 16.1  09.6  04.5   Hematocrit 36.0 - 46.0 % 39.8  39.4  38.8   Platelets 150 - 400 K/uL 288  302  269       Latest Ref Rng & Units 08/15/2022   10:31 AM  CMP  Glucose 70 - 99 mg/dL 95   BUN 6 - 20 mg/dL 12   Creatinine 4.09 - 1.00 mg/dL 8.11   Sodium 914 - 782 mmol/L 136   Potassium 3.5 - 5.1 mmol/L 4.5   Chloride 98 - 111 mmol/L 107   CO2 22 - 32 mmol/L 26   Calcium 8.9 - 10.3 mg/dL 8.7   Total Protein 6.5 - 8.1 g/dL 7.6   Total Bilirubin 0.3 - 1.2 mg/dL 0.5   Alkaline Phos 38 - 126 U/L 43   AST 15 - 41 U/L 19   ALT 0 - 44 U/L 19     Iron/TIBC/Ferritin/ %Sat    Component Value Date/Time   IRON 66 04/14/2023 1113   TIBC 405 04/14/2023 1113   FERRITIN 19 04/14/2023 1113   IRONPCTSAT 16 04/14/2023 1113      RADIOGRAPHIC STUDIES: I have personally reviewed the radiological images as listed and agreed with the findings in the report. No results found.

## 2023-04-19 NOTE — Assessment & Plan Note (Signed)
Labs are reviewed and discussed with patient. Hemoglobin remains normal, iron panel has improved.  Given that she continues to have ongoing blood loss from irregular heavy menstrual period I recommend additional IV Venofer x 1.  After that, recommend patient to take oral iron supplementation as maintenance.

## 2023-04-19 NOTE — Progress Notes (Signed)
Pt states that she isn't feeling as tired anymore.

## 2023-10-23 ENCOUNTER — Inpatient Hospital Stay: Payer: 59 | Attending: Oncology

## 2023-10-23 ENCOUNTER — Other Ambulatory Visit: Payer: 59

## 2023-10-23 DIAGNOSIS — N920 Excessive and frequent menstruation with regular cycle: Secondary | ICD-10-CM | POA: Insufficient documentation

## 2023-10-23 DIAGNOSIS — E611 Iron deficiency: Secondary | ICD-10-CM | POA: Insufficient documentation

## 2023-10-24 ENCOUNTER — Inpatient Hospital Stay: Payer: 59 | Admitting: Oncology

## 2023-10-24 ENCOUNTER — Inpatient Hospital Stay: Payer: 59

## 2023-10-24 ENCOUNTER — Encounter: Payer: Self-pay | Admitting: Oncology

## 2023-10-24 ENCOUNTER — Ambulatory Visit: Payer: 59

## 2023-10-24 VITALS — HR 69 | Temp 97.5°F | Resp 18 | Wt 243.4 lb

## 2023-10-24 DIAGNOSIS — N920 Excessive and frequent menstruation with regular cycle: Secondary | ICD-10-CM | POA: Diagnosis present

## 2023-10-24 DIAGNOSIS — E611 Iron deficiency: Secondary | ICD-10-CM

## 2023-10-24 DIAGNOSIS — N921 Excessive and frequent menstruation with irregular cycle: Secondary | ICD-10-CM | POA: Diagnosis not present

## 2023-10-24 LAB — RETIC PANEL
Immature Retic Fract: 15.2 % (ref 2.3–15.9)
RBC.: 4.38 MIL/uL (ref 3.87–5.11)
Retic Count, Absolute: 75.8 10*3/uL (ref 19.0–186.0)
Retic Ct Pct: 1.7 % (ref 0.4–3.1)
Reticulocyte Hemoglobin: 28.8 pg (ref >27.9)

## 2023-10-24 LAB — CBC WITH DIFFERENTIAL (CANCER CENTER ONLY)
Abs Immature Granulocytes: 0.01 10*3/uL (ref 0.00–0.07)
Basophils Absolute: 0 10*3/uL (ref 0.0–0.1)
Basophils Relative: 0 %
Eosinophils Absolute: 0.1 10*3/uL (ref 0.0–0.5)
Eosinophils Relative: 1 %
HCT: 37.7 % (ref 36.0–46.0)
Hemoglobin: 12.2 g/dL (ref 12.0–15.0)
Immature Granulocytes: 0 %
Lymphocytes Relative: 28 %
Lymphs Abs: 2 10*3/uL (ref 0.7–4.0)
MCH: 28 pg (ref 26.0–34.0)
MCHC: 32.4 g/dL (ref 30.0–36.0)
MCV: 86.7 fL (ref 80.0–100.0)
Monocytes Absolute: 0.5 10*3/uL (ref 0.1–1.0)
Monocytes Relative: 6 %
Neutro Abs: 4.5 10*3/uL (ref 1.7–7.7)
Neutrophils Relative %: 65 %
Platelet Count: 295 10*3/uL (ref 150–400)
RBC: 4.35 MIL/uL (ref 3.87–5.11)
RDW: 13.4 % (ref 11.5–15.5)
WBC Count: 7 10*3/uL (ref 4.0–10.5)
nRBC: 0 % (ref 0.0–0.2)

## 2023-10-24 LAB — IRON AND TIBC
Iron: 44 ug/dL (ref 28–170)
Saturation Ratios: 9 % — ABNORMAL LOW (ref 10.4–31.8)
TIBC: 472 ug/dL — ABNORMAL HIGH (ref 250–450)
UIBC: 428 ug/dL

## 2023-10-24 LAB — FERRITIN: Ferritin: 4 ng/mL — ABNORMAL LOW (ref 11–307)

## 2023-10-24 NOTE — Assessment & Plan Note (Addendum)
Follow up with Gyn.

## 2023-10-24 NOTE — Assessment & Plan Note (Addendum)
Labs are reviewed and discussed with patient. Lab Results  Component Value Date   HGB 12.2 10/24/2023   TIBC 472 (H) 10/24/2023   IRONPCTSAT 9 (L) 10/24/2023   FERRITIN 4 (L) 10/24/2023     I recommend IV Venofer x 4 After that, recommend patient to take oral iron supplementation as maintenance.

## 2023-10-24 NOTE — Progress Notes (Signed)
Hematology/Oncology Progress note [phone] [phone]        CHIEF COMPLAINTS/REASON FOR VISIT:  Follow-up for iron deficiency   ASSESSMENT & PLAN:   Iron deficiency Labs are reviewed and discussed with patient. Lab Results  Component Value Date   HGB 12.2 10/24/2023   TIBC 472 (H) 10/24/2023   IRONPCTSAT 9 (L) 10/24/2023   FERRITIN 4 (L) 10/24/2023     I recommend IV Venofer x 4 After that, recommend patient to take oral iron supplementation as maintenance.  Menorrhagia Follow up with Gyn    Orders Placed This Encounter  Procedures   CBC with Differential (Cancer Center Only)    Standing Status:   Future    Standing Expiration Date:   10/23/2024   Iron and TIBC    Standing Status:   Future    Standing Expiration Date:   10/23/2024   Ferritin    Standing Status:   Future    Standing Expiration Date:   10/23/2024   Retic Panel    Standing Status:   Future    Standing Expiration Date:   10/23/2024   Follow up in 6 months All questions were answered. The patient knows to call the clinic with any problems, questions or concerns.  Rickard Patience, MD, PhD [facility] Hematology Oncology 10/24/2023    HISTORY OF PRESENTING ILLNESS:   [name] is a  42 y.o.  female with PMH listed below was seen in consultation at the request of  Brynda Peon, MD  for evaluation of Iron deficiency  Patient has had blood work done at primary care provider's office and the results were scanned into media.  05/21/2021, iron panel showed saturation of 27, TIBC 442, ferritin 12. 06/21/2021, CBC showed hemoglobin 13.5, hematocrit 41.3, WBC 5.1, platelet count 245,000. 07/08/2021, iron panel showed a TIBC of 425, ferritin 14, iron saturation 21. 10/22/2021, patient had a brain MRI without contrast done for work-up of paresthesia.  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  10/28/2021, iron panel showed TIBC 368, iron saturation 21, ferritin 10. CBC showed a hemoglobin of 13.2, hematocrit 41.2, MCV 88.4.  Normal white count and platelet count.  Patient reports that she has been taking oral iron supplementation since she was informed that she has low iron level Initially she were taking Vitron C 2 tablets daily and she reports having upset stomach and constipation as side effects. Currently she takes 1 tablet of Vitron-C and another tablet of blood builder and feels the combination works better for her.  She reports a history of heavy menstrual bleeding.  Menstrual cycle  Is regular. Denies any black stool or bright red blood in the stool. Patient is in the process of having sleep apnea worked up, planned sleep study 12/31/2021.Marland Kitchen  Patient reports feeling tired.  She has chronic forearm and hand numbness and tingling.  she was diagnosed with sleep apnea and uses CPAP machine at night.   INTERVAL HISTORY [name] is a 42 y.o. female who has above history reviewed by me today presents for follow up visit for iron deficiency Patient feels less fatigued.  Continues to have heavy menstrual bleeding. She has not been taking Vitron C due to busy schedule.   Review of Systems  Constitutional:  Positive for fatigue. Negative for appetite change, chills  and fever.  HENT:   Negative for hearing loss and voice change.   Eyes:  Negative for eye problems.  Respiratory:  Negative for chest tightness and cough.   Cardiovascular:  Negative for chest pain.  Gastrointestinal:  Negative for abdominal distention, abdominal pain and blood in stool.  Endocrine: Negative for hot flashes.  Genitourinary:  Positive for menstrual problem. Negative for difficulty urinating and frequency.   Musculoskeletal:  Negative for arthralgias.  Skin:  Negative for itching and rash.   Neurological:  Negative for extremity weakness and numbness.  Hematological:  Negative for adenopathy.  Psychiatric/Behavioral:  Negative for confusion.     MEDICAL HISTORY:    SURGICAL HISTORY: Past Surgical History:  Procedure Laterality Date   CESAREAN SECTION      SOCIAL HISTORY: Social History   Socioeconomic History   Marital status: Married    Spouse name: Not on file   Number of children: Not on file   Years of education: Not on file   Highest education level: Not on file  Occupational History   Not on file  Tobacco Use   Smoking status: Never   Smokeless tobacco: Never  Substance and Sexual Activity   Alcohol use: Yes   Drug use: Never   Sexual activity: Yes  Other Topics Concern   Not on file  Social History Narrative   Not on file   Social Determinants of Health   Financial Resource Strain: Not on file  Food Insecurity: Not on file  Transportation Needs: Not on file  Physical Activity: Not on file  Stress: Not on file  Social Connections: Not on file  Intimate Partner Violence: Not on file    FAMILY HISTORY: Family History  Problem Relation Age of Onset   Arthritis/Rheumatoid Mother    Diabetes Mellitus I Father    Breast cancer Sister    Stroke Maternal Grandmother    Throat cancer Maternal Grandfather    Lung cancer Paternal Grandmother    Heart attack Paternal Grandfather     ALLERGIES:  is allergic to sumatriptan.  MEDICATIONS:  Current Outpatient Medications  Medication Sig Dispense Refill   Ergocalciferol (VITAMIN D2 PO) TAKE 1 CAPSULE BY MOUTH ONCE WEEKLY FOR 8 WEEKS THEN 1 CAPSULE EVERY MONTH (Patient not taking: Reported on 08/17/2022)     Iron-Vitamin C 65-125 MG TABS Take by mouth once. (Patient not taking: Reported on 08/17/2022)     MAGNESIUM OXIDE 400 PO Take by mouth. (Patient not taking: Reported on 08/17/2022)     Multiple Vitamin (MULTIVITAMIN ADULT PO) Take 1 tablet by mouth daily. MEGA FOODS: BLOODBUILDER (Patient not  taking: Reported on 08/17/2022)     pyridoxine (B-6) 100 MG tablet Take 100 mg by mouth daily. (Patient not taking: Reported on 12/14/2022)     Semaglutide-Weight Management (WEGOVY) 0.25 MG/0.5ML SOAJ Inject by subcutaneous route for 28 days. (Patient not taking: Reported on 12/14/2022)     No current facility-administered medications for this visit.     PHYSICAL EXAMINATION: ECOG PERFORMANCE STATUS: 0 - Asymptomatic Vitals:   10/24/23 1413  Pulse: 69  Resp: 18  Temp: (!) 97.5 F (36.4 C)  SpO2: 99%   Filed Weights   10/24/23 1413  Weight: 243 lb 6.4 oz (110.4 kg)    Physical Exam Constitutional:      General: She is not in acute distress. HENT:     Head: Normocephalic and atraumatic.  Eyes:     General: No scleral icterus. Cardiovascular:  Rate and Rhythm: Normal rate.  Pulmonary:     Effort: Pulmonary effort is normal. No respiratory distress.  Abdominal:     General: There is no distension.  Musculoskeletal:        General: Normal range of motion.     Cervical back: Normal range of motion and neck supple.  Skin:    Findings: No erythema or rash.  Neurological:     Mental Status: She is alert and oriented to person, place, and time. Mental status is at baseline.  Psychiatric:        Mood and Affect: Mood normal.     LABORATORY DATA:  I have reviewed the data as listed    Latest Ref Rng & Units 10/24/2023    1:56 PM 04/14/2023   11:13 AM 12/09/2022   11:52 AM  CBC  WBC 4.0 - 10.5 K/uL 7.0  7.3  6.2   Hemoglobin 12.0 - 15.0 g/dL 16.1  09.6  04.5   Hematocrit 36.0 - 46.0 % 37.7  39.8  39.4   Platelets 150 - 400 K/uL 295  288  302       Latest Ref Rng & Units 08/15/2022   10:31 AM  CMP  Glucose 70 - 99 mg/dL 95   BUN 6 - 20 mg/dL 12   Creatinine 4.09 - 1.00 mg/dL 8.11   Sodium 914 - 782 mmol/L 136   Potassium 3.5 - 5.1 mmol/L 4.5   Chloride 98 - 111 mmol/L 107   CO2 22 - 32 mmol/L 26   Calcium 8.9 - 10.3 mg/dL 8.7   Total Protein 6.5 - 8.1 g/dL 7.6    Total Bilirubin 0.3 - 1.2 mg/dL 0.5   Alkaline Phos 38 - 126 U/L 43   AST 15 - 41 U/L 19   ALT 0 - 44 U/L 19     Iron/TIBC/Ferritin/ %Sat    Component Value Date/Time   IRON 44 10/24/2023 1356   TIBC 472 (H) 10/24/2023 1356   FERRITIN 4 (L) 10/24/2023 1356   IRONPCTSAT 9 (L) 10/24/2023 1356      RADIOGRAPHIC STUDIES: I have personally reviewed the radiological images as listed and agreed with the findings in the report. No results found.

## 2023-10-25 ENCOUNTER — Telehealth: Payer: Self-pay

## 2023-10-25 NOTE — Telephone Encounter (Signed)
VM left for pt, informing her of MD recommendation. Asked her to call back for iron infusion appt scheduling.

## 2023-10-25 NOTE — Telephone Encounter (Signed)
-----   Message from Rickard Patience sent at 10/24/2023  8:39 PM EST ----- Iron panel is still low.  Please arrange her to get IV Venofer weekly x 4. Thanks.

## 2023-10-26 ENCOUNTER — Encounter: Payer: Self-pay | Admitting: Oncology

## 2023-10-27 ENCOUNTER — Inpatient Hospital Stay: Payer: 59

## 2023-10-27 VITALS — BP 135/78 | HR 83 | Temp 97.2°F | Resp 16

## 2023-10-27 DIAGNOSIS — E611 Iron deficiency: Secondary | ICD-10-CM

## 2023-10-27 MED ORDER — IRON SUCROSE 20 MG/ML IV SOLN
200.0000 mg | Freq: Once | INTRAVENOUS | Status: AC
  Administered 2023-10-27: 200 mg via INTRAVENOUS
  Filled 2023-10-27: qty 10

## 2023-11-03 ENCOUNTER — Inpatient Hospital Stay: Payer: 59

## 2023-11-03 VITALS — BP 145/71 | HR 79 | Temp 97.5°F | Resp 19

## 2023-11-03 DIAGNOSIS — E611 Iron deficiency: Secondary | ICD-10-CM

## 2023-11-03 MED ORDER — SODIUM CHLORIDE 0.9% FLUSH
10.0000 mL | Freq: Once | INTRAVENOUS | Status: AC | PRN
  Administered 2023-11-03: 10 mL
  Filled 2023-11-03: qty 10

## 2023-11-03 MED ORDER — IRON SUCROSE 20 MG/ML IV SOLN
200.0000 mg | Freq: Once | INTRAVENOUS | Status: AC
  Administered 2023-11-03: 200 mg via INTRAVENOUS
  Filled 2023-11-03: qty 10

## 2023-11-10 ENCOUNTER — Inpatient Hospital Stay: Payer: 59

## 2023-11-10 VITALS — BP 145/73 | HR 67 | Temp 96.7°F | Resp 19

## 2023-11-10 DIAGNOSIS — E611 Iron deficiency: Secondary | ICD-10-CM

## 2023-11-10 MED ORDER — SODIUM CHLORIDE 0.9% FLUSH
10.0000 mL | Freq: Once | INTRAVENOUS | Status: AC | PRN
  Administered 2023-11-10: 10 mL
  Filled 2023-11-10: qty 10

## 2023-11-10 MED ORDER — IRON SUCROSE 20 MG/ML IV SOLN
200.0000 mg | Freq: Once | INTRAVENOUS | Status: AC
  Administered 2023-11-10: 200 mg via INTRAVENOUS
  Filled 2023-11-10: qty 10

## 2023-11-17 ENCOUNTER — Inpatient Hospital Stay: Payer: 59

## 2023-11-17 VITALS — BP 131/84 | HR 85 | Temp 98.0°F | Resp 18

## 2023-11-17 DIAGNOSIS — E611 Iron deficiency: Secondary | ICD-10-CM | POA: Diagnosis not present

## 2023-11-17 MED ORDER — IRON SUCROSE 20 MG/ML IV SOLN
200.0000 mg | Freq: Once | INTRAVENOUS | Status: AC
  Administered 2023-11-17: 200 mg via INTRAVENOUS
  Filled 2023-11-17: qty 10

## 2024-04-23 ENCOUNTER — Inpatient Hospital Stay: Payer: 59 | Attending: Oncology

## 2024-04-23 DIAGNOSIS — E611 Iron deficiency: Secondary | ICD-10-CM | POA: Diagnosis present

## 2024-04-23 LAB — CBC WITH DIFFERENTIAL (CANCER CENTER ONLY)
Abs Immature Granulocytes: 0.05 10*3/uL (ref 0.00–0.07)
Basophils Absolute: 0 10*3/uL (ref 0.0–0.1)
Basophils Relative: 1 %
Eosinophils Absolute: 0.1 10*3/uL (ref 0.0–0.5)
Eosinophils Relative: 1 %
HCT: 38 % (ref 36.0–46.0)
Hemoglobin: 12.1 g/dL (ref 12.0–15.0)
Immature Granulocytes: 1 %
Lymphocytes Relative: 26 %
Lymphs Abs: 1.6 10*3/uL (ref 0.7–4.0)
MCH: 27.8 pg (ref 26.0–34.0)
MCHC: 31.8 g/dL (ref 30.0–36.0)
MCV: 87.4 fL (ref 80.0–100.0)
Monocytes Absolute: 0.4 10*3/uL (ref 0.1–1.0)
Monocytes Relative: 7 %
Neutro Abs: 4 10*3/uL (ref 1.7–7.7)
Neutrophils Relative %: 64 %
Platelet Count: 297 10*3/uL (ref 150–400)
RBC: 4.35 MIL/uL (ref 3.87–5.11)
RDW: 13.9 % (ref 11.5–15.5)
WBC Count: 6.1 10*3/uL (ref 4.0–10.5)
nRBC: 0 % (ref 0.0–0.2)

## 2024-04-23 LAB — RETIC PANEL
Immature Retic Fract: 16.2 % — ABNORMAL HIGH (ref 2.3–15.9)
RBC.: 4.3 MIL/uL (ref 3.87–5.11)
Retic Count, Absolute: 85.6 10*3/uL (ref 19.0–186.0)
Retic Ct Pct: 2 % (ref 0.4–3.1)
Reticulocyte Hemoglobin: 29.7 pg (ref >27.9)

## 2024-04-23 LAB — IRON AND TIBC
Iron: 51 ug/dL (ref 28–170)
Saturation Ratios: 12 % (ref 10.4–31.8)
TIBC: 412 ug/dL (ref 250–450)
UIBC: 361 ug/dL

## 2024-04-23 LAB — FERRITIN: Ferritin: 10 ng/mL — ABNORMAL LOW (ref 11–307)

## 2024-04-30 ENCOUNTER — Inpatient Hospital Stay: Payer: 59

## 2024-04-30 ENCOUNTER — Inpatient Hospital Stay (HOSPITAL_BASED_OUTPATIENT_CLINIC_OR_DEPARTMENT_OTHER): Payer: 59 | Admitting: Oncology

## 2024-04-30 ENCOUNTER — Encounter: Payer: Self-pay | Admitting: Oncology

## 2024-04-30 VITALS — BP 117/78 | HR 86 | Temp 97.6°F | Resp 16 | Wt 244.0 lb

## 2024-04-30 VITALS — BP 133/86 | HR 77 | Resp 16

## 2024-04-30 DIAGNOSIS — E611 Iron deficiency: Secondary | ICD-10-CM | POA: Diagnosis not present

## 2024-04-30 MED ORDER — IRON SUCROSE 20 MG/ML IV SOLN
200.0000 mg | Freq: Once | INTRAVENOUS | Status: AC
  Administered 2024-04-30: 200 mg via INTRAVENOUS
  Filled 2024-04-30: qty 10

## 2024-04-30 NOTE — Progress Notes (Signed)
 Pt reports feeling fatigued and increasing weekly.

## 2024-04-30 NOTE — Progress Notes (Signed)
 Hematology/Oncology Progress note [phone] Fax:([phone]        CHIEF COMPLAINTS/REASON FOR VISIT:  Follow-up for iron  deficiency   ASSESSMENT & PLAN:   Iron  deficiency Labs are reviewed and discussed with patient. Lab Results  Component Value Date   HGB 12.1 04/23/2024   TIBC 412 04/23/2024   IRONPCTSAT 12 04/23/2024   FERRITIN 10 (L) 04/23/2024     I recommend IV Venofer  x 2 to further improve iron  stores. Continue oral iron  supplementation as maintenance.   Orders Placed This Encounter  Procedures   CBC with Differential (Cancer Center Only)    Standing Status:   Future    Expected Date:   10/30/2024    Expiration Date:   04/30/2025   Iron  and TIBC    Standing Status:   Future    Expected Date:   10/30/2024    Expiration Date:   04/30/2025   Ferritin    Standing Status:   Future    Expected Date:   10/30/2024    Expiration Date:   04/30/2025   Retic Panel    Standing Status:   Future    Expected Date:   10/30/2024    Expiration Date:   04/30/2025   Follow up in 6 months All questions were answered. The patient knows to call the clinic with any problems, questions or concerns.  Victoria Forbes, MD, PhD [facility] Hematology Oncology 04/30/2024    HISTORY OF PRESENTING ILLNESS:   [name] is a  43 y.o.  female with PMH listed below was seen in consultation at the request of  Victoria Farr, MD  for evaluation of Iron  deficiency  Patient has had blood work done at primary care provider's office and the results were scanned into media.  05/21/2021, iron  panel showed saturation of 27, TIBC 442, ferritin 12. 06/21/2021, CBC showed hemoglobin 13.5, hematocrit 41.3, WBC 5.1, platelet count 245,000. 07/08/2021, iron  panel showed a TIBC of 425, ferritin 14, iron  saturation 21. 10/22/2021, patient had a brain MRI without contrast done for work-up of paresthesia.  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  10/28/2021, iron  panel showed TIBC 368, iron  saturation 21, ferritin 10. CBC showed a hemoglobin of 13.2, hematocrit 41.2, MCV 88.4.  Normal white count and platelet count.  Patient reports that she has been taking oral iron  supplementation since she was informed that she has low iron  level Initially she were taking Vitron C 2 tablets daily and she reports having upset stomach and constipation as side effects. Currently she takes 1 tablet of Vitron-C and another tablet of blood builder and feels the combination works better for her.  She reports a history of heavy menstrual bleeding.  Menstrual cycle  Is regular. Denies any black stool or bright red blood in the stool. Patient is in the process of having sleep apnea worked up, planned sleep study 12/31/2021.[name]  Patient reports feeling tired.  She has chronic forearm and hand numbness and tingling.  she was diagnosed with sleep apnea and uses CPAP machine at night.   INTERVAL HISTORY [name] is a 43 y.o. female who has above history reviewed by me today presents for follow up visit for iron  deficiency Patient feels tired, getting worse gradually.  She previously tolerated IV Venofer  treatments very well. She takes Vitron C "  on and off".  Review of Systems  Constitutional:  Positive for fatigue. Negative for appetite change, chills and fever.  HENT:   Negative for hearing loss and voice change.   Eyes:  Negative for eye problems.  Respiratory:  Negative for chest tightness and cough.   Cardiovascular:  Negative for chest pain.  Gastrointestinal:  Negative for abdominal distention, abdominal pain and blood in stool.  Endocrine: Negative for hot flashes.  Genitourinary:  Positive for menstrual problem. Negative for difficulty urinating and frequency.   Musculoskeletal:   Negative for arthralgias.  Skin:  Negative for itching and rash.  Neurological:  Negative for extremity weakness and numbness.  Hematological:  Negative for adenopathy.  Psychiatric/Behavioral:  Negative for confusion.     MEDICAL HISTORY:    SURGICAL HISTORY: Past Surgical History:  Procedure Laterality Date   CESAREAN SECTION      SOCIAL HISTORY: Social History   Socioeconomic History   Marital status: Married    Spouse name: Not on file   Number of children: Not on file   Years of education: Not on file   Highest education level: Not on file  Occupational History   Not on file  Tobacco Use   Smoking status: Never   Smokeless tobacco: Never  Substance and Sexual Activity   Alcohol use: Yes   Drug use: Never   Sexual activity: Yes  Other Topics Concern   Not on file  Social History Narrative   Not on file   Social Drivers of Health   Financial Resource Strain: Not on file  Food Insecurity: Not on file  Transportation Needs: Not on file  Physical Activity: Not on file  Stress: Not on file  Social Connections: Not on file  Intimate Partner Violence: Not on file    FAMILY HISTORY: Family History  Problem Relation Age of Onset   Arthritis/Rheumatoid Mother    Diabetes Mellitus I Father    Breast cancer Sister    Stroke Maternal Grandmother    Throat cancer Maternal Grandfather    Lung cancer Paternal Grandmother    Heart attack Paternal Grandfather     ALLERGIES:  is allergic to sumatriptan.  MEDICATIONS:  Current Outpatient Medications  Medication Sig Dispense Refill   Multiple Vitamin (MULTIVITAMIN ADULT PO) Take 1 tablet by mouth daily. MEGA FOODS: BLOODBUILDER     No current facility-administered medications for this visit.     PHYSICAL EXAMINATION: ECOG PERFORMANCE STATUS: 0 - Asymptomatic Vitals:   04/30/24 1452  BP: 117/78  Pulse: 86  Resp: 16  Temp: 97.6 F (36.4 C)  SpO2: 100%   Filed Weights   04/30/24 1452  Weight: 244 lb  (110.7 kg)    Physical Exam Constitutional:      General: She is not in acute distress. HENT:     Head: Normocephalic and atraumatic.  Eyes:     General: No scleral icterus. Cardiovascular:     Rate and Rhythm: Normal rate.  Pulmonary:     Effort: Pulmonary effort is normal. No respiratory distress.  Abdominal:     General: There is no distension.  Musculoskeletal:        General: Normal range of motion.     Cervical back: Normal range of motion and neck supple.  Skin:    Findings: No erythema or rash.  Neurological:     Mental Status: She is alert and oriented to person, place, and time. Mental status is at baseline.  Psychiatric:  Mood and Affect: Mood normal.     LABORATORY DATA:  I have reviewed the data as listed    Latest Ref Rng & Units 04/23/2024    2:27 PM 10/24/2023    1:56 PM 04/14/2023   11:13 AM  CBC  WBC 4.0 - 10.5 K/uL 6.1  7.0  7.3   Hemoglobin 12.0 - 15.0 g/dL 09.8  11.9  14.7   Hematocrit 36.0 - 46.0 % 38.0  37.7  39.8   Platelets 150 - 400 K/uL 297  295  288       Latest Ref Rng & Units 08/15/2022   10:31 AM  CMP  Glucose 70 - 99 mg/dL 95   BUN 6 - 20 mg/dL 12   Creatinine 8.29 - 1.00 mg/dL 5.62   Sodium 130 - 865 mmol/L 136   Potassium 3.5 - 5.1 mmol/L 4.5   Chloride 98 - 111 mmol/L 107   CO2 22 - 32 mmol/L 26   Calcium 8.9 - 10.3 mg/dL 8.7   Total Protein 6.5 - 8.1 g/dL 7.6   Total Bilirubin 0.3 - 1.2 mg/dL 0.5   Alkaline Phos 38 - 126 U/L 43   AST 15 - 41 U/L 19   ALT 0 - 44 U/L 19     Iron /TIBC/Ferritin/ %Sat    Component Value Date/Time   IRON  51 04/23/2024 1427   TIBC 412 04/23/2024 1427   FERRITIN 10 (L) 04/23/2024 1427   IRONPCTSAT 12 04/23/2024 1427      RADIOGRAPHIC STUDIES: I have personally reviewed the radiological images as listed and agreed with the findings in the report. No results found.

## 2024-04-30 NOTE — Patient Instructions (Signed)
Iron Sucrose Injection What is this medication? IRON SUCROSE (EYE ern SOO krose) treats low levels of iron (iron deficiency anemia) in people with kidney disease. Iron is a mineral that plays an important role in making red blood cells, which carry oxygen from your lungs to the rest of your body. This medicine may be used for other purposes; ask your health care provider or pharmacist if you have questions. COMMON BRAND NAME(S): Venofer What should I tell my care team before I take this medication? They need to know if you have any of these conditions: Anemia not caused by low iron levels Heart disease High levels of iron in the blood Kidney disease Liver disease An unusual or allergic reaction to iron, other medications, foods, dyes, or preservatives Pregnant or trying to get pregnant Breastfeeding How should I use this medication? This medication is for infusion into a vein. It is given in a hospital or clinic setting. Talk to your care team about the use of this medication in children. While this medication may be prescribed for children as young as 2 years for selected conditions, precautions do apply. Overdosage: If you think you have taken too much of this medicine contact a poison control center or emergency room at once. NOTE: This medicine is only for you. Do not share this medicine with others. What if I miss a dose? Keep appointments for follow-up doses. It is important not to miss your dose. Call your care team if you are unable to keep an appointment. What may interact with this medication? Do not take this medication with any of the following: Deferoxamine Dimercaprol Other iron products This medication may also interact with the following: Chloramphenicol Deferasirox This list may not describe all possible interactions. Give your health care provider a list of all the medicines, herbs, non-prescription drugs, or dietary supplements you use. Also tell them if you smoke,  drink alcohol, or use illegal drugs. Some items may interact with your medicine. What should I watch for while using this medication? Visit your care team regularly. Tell your care team if your symptoms do not start to get better or if they get worse. You may need blood work done while you are taking this medication. You may need to follow a special diet. Talk to your care team. Foods that contain iron include: whole grains/cereals, dried fruits, beans, or peas, leafy green vegetables, and organ meats (liver, kidney). What side effects may I notice from receiving this medication? Side effects that you should report to your care team as soon as possible: Allergic reactions--skin rash, itching, hives, swelling of the face, lips, tongue, or throat Low blood pressure--dizziness, feeling faint or lightheaded, blurry vision Shortness of breath Side effects that usually do not require medical attention (report to your care team if they continue or are bothersome): Flushing Headache Joint pain Muscle pain Nausea Pain, redness, or irritation at injection site This list may not describe all possible side effects. Call your doctor for medical advice about side effects. You may report side effects to FDA at 1-800-FDA-1088. Where should I keep my medication? This medication is given in a hospital or clinic and will not be stored at home. NOTE: This sheet is a summary. It may not cover all possible information. If you have questions about this medicine, talk to your doctor, pharmacist, or health care provider.  2023 Elsevier/Gold Standard (2021-02-18 00:00:00)  

## 2024-04-30 NOTE — Assessment & Plan Note (Addendum)
 Labs are reviewed and discussed with patient. Lab Results  Component Value Date   HGB 12.1 04/23/2024   TIBC 412 04/23/2024   IRONPCTSAT 12 04/23/2024   FERRITIN 10 (L) 04/23/2024     I recommend IV Venofer  x 2 to further improve iron  stores. Continue oral iron  supplementation as maintenance.

## 2024-05-14 ENCOUNTER — Inpatient Hospital Stay

## 2024-05-14 VITALS — BP 126/70 | HR 72 | Temp 97.6°F | Resp 17

## 2024-05-14 DIAGNOSIS — E611 Iron deficiency: Secondary | ICD-10-CM | POA: Diagnosis not present

## 2024-05-14 MED ORDER — SODIUM CHLORIDE 0.9% FLUSH
10.0000 mL | Freq: Once | INTRAVENOUS | Status: AC
  Administered 2024-05-14: 10 mL via INTRAVENOUS
  Filled 2024-05-14: qty 10

## 2024-05-14 MED ORDER — IRON SUCROSE 20 MG/ML IV SOLN
200.0000 mg | Freq: Once | INTRAVENOUS | Status: AC
  Administered 2024-05-14: 200 mg via INTRAVENOUS
  Filled 2024-05-14: qty 10

## 2024-05-14 NOTE — Progress Notes (Signed)
 Patient declined to wait the 30 minutes for post iron infusion observation today. Tolerated infusion well. VSS.

## 2024-10-29 ENCOUNTER — Inpatient Hospital Stay: Attending: Oncology

## 2024-10-29 DIAGNOSIS — E611 Iron deficiency: Secondary | ICD-10-CM | POA: Diagnosis present

## 2024-10-29 LAB — CBC WITH DIFFERENTIAL (CANCER CENTER ONLY)
Abs Immature Granulocytes: 0.09 K/uL — ABNORMAL HIGH (ref 0.00–0.07)
Basophils Absolute: 0 K/uL (ref 0.0–0.1)
Basophils Relative: 0 %
Eosinophils Absolute: 0.1 K/uL (ref 0.0–0.5)
Eosinophils Relative: 1 %
HCT: 36.9 % (ref 36.0–46.0)
Hemoglobin: 12.1 g/dL (ref 12.0–15.0)
Immature Granulocytes: 1 %
Lymphocytes Relative: 26 %
Lymphs Abs: 1.9 K/uL (ref 0.7–4.0)
MCH: 28.4 pg (ref 26.0–34.0)
MCHC: 32.8 g/dL (ref 30.0–36.0)
MCV: 86.6 fL (ref 80.0–100.0)
Monocytes Absolute: 0.5 K/uL (ref 0.1–1.0)
Monocytes Relative: 6 %
Neutro Abs: 4.8 K/uL (ref 1.7–7.7)
Neutrophils Relative %: 66 %
Platelet Count: 295 K/uL (ref 150–400)
RBC: 4.26 MIL/uL (ref 3.87–5.11)
RDW: 13.4 % (ref 11.5–15.5)
WBC Count: 7.4 K/uL (ref 4.0–10.5)
nRBC: 0 % (ref 0.0–0.2)

## 2024-10-29 LAB — RETIC PANEL
Immature Retic Fract: 20.9 % — ABNORMAL HIGH (ref 2.3–15.9)
RBC.: 4.28 MIL/uL (ref 3.87–5.11)
Retic Count, Absolute: 98.4 K/uL (ref 19.0–186.0)
Retic Ct Pct: 2.3 % (ref 0.4–3.1)
Reticulocyte Hemoglobin: 30.7 pg (ref >27.9)

## 2024-10-29 LAB — IRON AND TIBC
Iron: 59 ug/dL (ref 28–170)
Saturation Ratios: 13 % (ref 10.4–31.8)
TIBC: 445 ug/dL (ref 250–450)
UIBC: 386 ug/dL

## 2024-10-29 LAB — FERRITIN: Ferritin: 12 ng/mL (ref 11–307)

## 2024-11-05 ENCOUNTER — Inpatient Hospital Stay: Admitting: Oncology

## 2024-11-05 ENCOUNTER — Encounter: Payer: Self-pay | Admitting: Oncology

## 2024-11-05 ENCOUNTER — Ambulatory Visit

## 2024-11-05 VITALS — BP 119/71 | HR 69 | Temp 99.0°F | Resp 18 | Wt 247.4 lb

## 2024-11-05 DIAGNOSIS — E611 Iron deficiency: Secondary | ICD-10-CM | POA: Diagnosis not present

## 2024-11-05 DIAGNOSIS — N921 Excessive and frequent menstruation with irregular cycle: Secondary | ICD-10-CM

## 2024-11-05 LAB — PREGNANCY, URINE: Preg Test, Ur: NEGATIVE

## 2024-11-05 MED ORDER — IRON SUCROSE 20 MG/ML IV SOLN
200.0000 mg | Freq: Once | INTRAVENOUS | Status: AC
  Administered 2024-11-05: 16:00:00 200 mg via INTRAVENOUS
  Filled 2024-11-05: qty 10

## 2024-11-05 NOTE — Patient Instructions (Signed)

## 2024-11-05 NOTE — Assessment & Plan Note (Addendum)
 Labs are reviewed and discussed with patient. Lab Results  Component Value Date   HGB 12.1 10/29/2024   TIBC 445 10/29/2024   IRONPCTSAT 13 10/29/2024   FERRITIN 12 10/29/2024    Both hemoglobin and iron  panel have improved.  Patient has ongoing heavy menstrual bleeding. I recommend IV Venofer  x 1 to further improve iron  stores. Continue oral iron  supplementation as maintenance.

## 2024-11-05 NOTE — Progress Notes (Signed)
 Hematology/Oncology Progress note Telephone:([phone]) [phone] Fax:(336) [phone]        CHIEF COMPLAINTS/REASON FOR VISIT:  Follow-up for iron  deficiency   ASSESSMENT & PLAN:   Iron  deficiency Labs are reviewed and discussed with patient. Lab Results  Component Value Date   HGB 12.1 10/29/2024   TIBC 445 10/29/2024   IRONPCTSAT 13 10/29/2024   FERRITIN 12 10/29/2024    Both hemoglobin and iron  panel have improved.  Patient has ongoing heavy menstrual bleeding. I recommend IV Venofer  x 1 to further improve iron  stores. Continue oral iron  supplementation as maintenance.  Menorrhagia Recommend patient to establish care with gynecology for further evaluation.   Orders Placed This Encounter  Procedures   CBC with Differential (Cancer Center Only)    Standing Status:   Future    Expected Date:   05/06/2025    Expiration Date:   08/04/2025   Iron  and TIBC    Standing Status:   Future    Expected Date:   05/06/2025    Expiration Date:   08/04/2025   Ferritin    Standing Status:   Future    Expected Date:   05/06/2025    Expiration Date:   08/04/2025   Pregnancy, urine    Standing Status:   Future    Expected Date:   05/06/2025    Expiration Date:   08/04/2025   Pregnancy, urine    Standing Status:   Future    Number of Occurrences:   1    Expected Date:   11/05/2024    Expiration Date:   02/03/2025   Follow up in 6 months All questions were answered. The patient knows to call the clinic with any problems, questions or concerns.  Zelphia Cap, MD, PhD [facility] Hematology Oncology 11/05/2024    HISTORY OF PRESENTING ILLNESS:   [name] is a  43 y.o.  female with PMH listed below was seen in consultation at the request of  Tatiana Delon GRADE, MD  for evaluation of Iron  deficiency  Patient has had blood work done at primary care provider's office and the results were scanned into media.  05/21/2021, iron  panel showed saturation of 27, TIBC 442, ferritin 12. 06/21/2021, CBC  showed hemoglobin 13.5, hematocrit 41.3, WBC 5.1, platelet count 245,000. 07/08/2021, iron  panel showed a TIBC of 425, ferritin 14, iron  saturation 21. 10/22/2021, patient had a brain MRI without contrast done for work-up of paresthesia.  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  10/28/2021, iron  panel showed TIBC 368, iron  saturation 21, ferritin 10. CBC showed a hemoglobin of 13.2, hematocrit 41.2, MCV 88.4.  Normal white count and platelet count.  Patient reports that she has been taking oral iron  supplementation since she was informed that she has low iron  level Initially she were taking Vitron C 2 tablets daily and she reports having upset stomach and constipation as side effects. Currently she takes 1 tablet of Vitron-C and another tablet of blood builder and feels the combination works better for her.  She reports a history of heavy menstrual bleeding.  Menstrual cycle  Is regular. Denies any black stool or bright red blood in the stool. Patient is in the process of having sleep apnea worked up, planned sleep study 12/31/2021.[name]  Patient reports feeling tired.  She has chronic forearm and hand numbness and  tingling.  she was diagnosed with sleep apnea and uses CPAP machine at night.   INTERVAL HISTORY [name] is a 43 y.o. female who has above history reviewed by me today presents for follow up visit for iron  deficiency Patient feels tired.  She previously tolerated IV Venofer  treatments very well. She currently takes blood builder vitamin.  She feels that iron  treatment has improved her fatigue in the past.  She has OSA is currently on CPAP she feels her sleep quality has improved after starting using CPAP. Review of Systems  Constitutional:  Positive for fatigue. Negative for  appetite change, chills and fever.  HENT:   Negative for hearing loss and voice change.   Eyes:  Negative for eye problems.  Respiratory:  Negative for chest tightness and cough.   Cardiovascular:  Negative for chest pain.  Gastrointestinal:  Negative for abdominal distention, abdominal pain and blood in stool.  Endocrine: Negative for hot flashes.  Genitourinary:  Positive for menstrual problem. Negative for difficulty urinating and frequency.   Musculoskeletal:  Negative for arthralgias.  Skin:  Negative for itching and rash.  Neurological:  Negative for extremity weakness and numbness.  Hematological:  Negative for adenopathy.  Psychiatric/Behavioral:  Negative for confusion.     MEDICAL HISTORY:    SURGICAL HISTORY: Past Surgical History:  Procedure Laterality Date   CESAREAN SECTION      SOCIAL HISTORY: Social History   Socioeconomic History   Marital status: Married    Spouse name: Not on file   Number of children: Not on file   Years of education: Not on file   Highest education level: Not on file  Occupational History   Not on file  Tobacco Use   Smoking status: Never   Smokeless tobacco: Never  Substance and Sexual Activity   Alcohol use: Yes   Drug use: Never   Sexual activity: Yes  Other Topics Concern   Not on file  Social History Narrative   Not on file   Social Drivers of Health   Tobacco Use: Low Risk (11/05/2024)   Patient History    Smoking Tobacco Use: Never    Smokeless Tobacco Use: Never    Passive Exposure: Not on file  Financial Resource Strain: Not on file  Food Insecurity: Not on file  Transportation Needs: Not on file  Physical Activity: Not on file  Stress: Not on file  Social Connections: Not on file  Intimate Partner Violence: Not on file  Depression (EYV7-0): Not on file  Alcohol Screen: Not on file  Housing: Not on file  Utilities: Not on file  Health Literacy: Not on file    FAMILY HISTORY: Family History  Problem  Relation Age of Onset   Arthritis/Rheumatoid Mother    Diabetes Mellitus I Father    Breast cancer Sister    Stroke Maternal Grandmother    Throat cancer Maternal Grandfather    Lung cancer Paternal Grandmother    Heart attack Paternal Grandfather     ALLERGIES:  is allergic to sumatriptan.  MEDICATIONS:  Current Outpatient Medications  Medication Sig Dispense Refill   Magnesium Oxide -Mg Supplement 500 MG TABS Take 1 tablet by mouth daily.     Multiple Vitamin (MULTIVITAMIN ADULT PO) Take 1 tablet by mouth daily. MEGA FOODS: BLOODBUILDER     Vitamin D-Vitamin K (VITAMIN K2-VITAMIN D3) 90-125 MCG CAPS Take 1 tablet by mouth daily.     No current facility-administered medications for this visit.  PHYSICAL EXAMINATION: ECOG PERFORMANCE STATUS: 0 - Asymptomatic Vitals:   11/05/24 1428  BP: 119/71  Pulse: 69  Resp: 18  Temp: 99 F (37.2 C)  SpO2: 100%   Filed Weights   11/05/24 1428  Weight: 247 lb 6.4 oz (112.2 kg)    Physical Exam Constitutional:      General: She is not in acute distress. HENT:     Head: Normocephalic and atraumatic.  Eyes:     General: No scleral icterus. Cardiovascular:     Rate and Rhythm: Normal rate.  Pulmonary:     Effort: Pulmonary effort is normal. No respiratory distress.  Abdominal:     General: There is no distension.  Musculoskeletal:        General: Normal range of motion.     Cervical back: Normal range of motion and neck supple.  Skin:    Findings: No erythema or rash.  Neurological:     Mental Status: She is alert and oriented to person, place, and time. Mental status is at baseline.  Psychiatric:        Mood and Affect: Mood normal.     LABORATORY DATA:  I have reviewed the data as listed    Latest Ref Rng & Units 10/29/2024    2:40 PM 04/23/2024    2:27 PM 10/24/2023    1:56 PM  CBC  WBC 4.0 - 10.5 K/uL 7.4  6.1  7.0   Hemoglobin 12.0 - 15.0 g/dL 87.8  87.8  87.7   Hematocrit 36.0 - 46.0 % 36.9  38.0  37.7    Platelets 150 - 400 K/uL 295  297  295       Latest Ref Rng & Units 08/15/2022   10:31 AM  CMP  Glucose 70 - 99 mg/dL 95   BUN 6 - 20 mg/dL 12   Creatinine 9.55 - 1.00 mg/dL 9.07   Sodium 864 - 854 mmol/L 136   Potassium 3.5 - 5.1 mmol/L 4.5   Chloride 98 - 111 mmol/L 107   CO2 22 - 32 mmol/L 26   Calcium 8.9 - 10.3 mg/dL 8.7   Total Protein 6.5 - 8.1 g/dL 7.6   Total Bilirubin 0.3 - 1.2 mg/dL 0.5   Alkaline Phos 38 - 126 U/L 43   AST 15 - 41 U/L 19   ALT 0 - 44 U/L 19     Iron /TIBC/Ferritin/ %Sat    Component Value Date/Time   IRON  59 10/29/2024 1440   TIBC 445 10/29/2024 1440   FERRITIN 12 10/29/2024 1440   IRONPCTSAT 13 10/29/2024 1440      RADIOGRAPHIC STUDIES: I have personally reviewed the radiological images as listed and agreed with the findings in the report. No results found.

## 2024-11-05 NOTE — Assessment & Plan Note (Signed)
 Recommend patient to establish care with gynecology for further evaluation.

## 2025-04-29 ENCOUNTER — Inpatient Hospital Stay

## 2025-05-06 ENCOUNTER — Inpatient Hospital Stay: Admitting: Oncology

## 2025-05-06 ENCOUNTER — Inpatient Hospital Stay
